# Patient Record
Sex: Male | Born: 1965 | Hispanic: No | Marital: Married | Smoking: Former smoker
Health system: Southern US, Community
[De-identification: ages and names within clinical notes are randomized; demographics above are authoritative.]

## PROBLEM LIST (undated history)

## (undated) DIAGNOSIS — I10 Essential (primary) hypertension: Secondary | ICD-10-CM

## (undated) DIAGNOSIS — E119 Type 2 diabetes mellitus without complications: Secondary | ICD-10-CM

---

## 1999-11-13 ENCOUNTER — Encounter: Payer: Self-pay | Admitting: Emergency Medicine

## 1999-11-13 ENCOUNTER — Inpatient Hospital Stay (HOSPITAL_COMMUNITY): Admission: EM | Admit: 1999-11-13 | Discharge: 1999-11-16 | Payer: Self-pay | Admitting: Emergency Medicine

## 1999-11-13 ENCOUNTER — Encounter: Payer: Self-pay | Admitting: Orthopedic Surgery

## 1999-11-14 ENCOUNTER — Encounter: Payer: Self-pay | Admitting: Orthopedic Surgery

## 1999-11-16 ENCOUNTER — Encounter: Payer: Self-pay | Admitting: Orthopedic Surgery

## 2002-06-02 ENCOUNTER — Encounter: Payer: Self-pay | Admitting: Emergency Medicine

## 2002-06-02 ENCOUNTER — Emergency Department (HOSPITAL_COMMUNITY): Admission: EM | Admit: 2002-06-02 | Discharge: 2002-06-02 | Payer: Self-pay | Admitting: Emergency Medicine

## 2002-10-27 ENCOUNTER — Ambulatory Visit (HOSPITAL_COMMUNITY): Admission: RE | Admit: 2002-10-27 | Discharge: 2002-10-27 | Payer: Self-pay | Admitting: Orthopedic Surgery

## 2002-10-27 ENCOUNTER — Encounter: Payer: Self-pay | Admitting: Orthopedic Surgery

## 2011-10-07 ENCOUNTER — Observation Stay (HOSPITAL_COMMUNITY)
Admission: EM | Admit: 2011-10-07 | Discharge: 2011-10-07 | Disposition: A | Discharge status: Home / Self Care | Payer: Self-pay | Attending: Emergency Medicine | Admitting: Emergency Medicine

## 2011-10-07 ENCOUNTER — Encounter (HOSPITAL_COMMUNITY): Payer: Self-pay | Admitting: Emergency Medicine

## 2011-10-07 DIAGNOSIS — F17200 Nicotine dependence, unspecified, uncomplicated: Secondary | ICD-10-CM | POA: Insufficient documentation

## 2011-10-07 DIAGNOSIS — T7840XA Allergy, unspecified, initial encounter: Principal | ICD-10-CM | POA: Insufficient documentation

## 2011-10-07 DIAGNOSIS — I10 Essential (primary) hypertension: Secondary | ICD-10-CM | POA: Insufficient documentation

## 2011-10-07 DIAGNOSIS — Y929 Unspecified place or not applicable: Secondary | ICD-10-CM | POA: Insufficient documentation

## 2011-10-07 HISTORY — DX: Essential (primary) hypertension: I10

## 2011-10-07 LAB — CBC
MCH: 30.8 pg (ref 26.0–34.0)
MCV: 87.7 fL (ref 78.0–100.0)
Platelets: 295 10*3/uL (ref 150–400)
RDW: 13.4 % (ref 11.5–15.5)

## 2011-10-07 LAB — BASIC METABOLIC PANEL
BUN: 17 mg/dL (ref 6–23)
CO2: 23 mEq/L (ref 19–32)
Calcium: 9.4 mg/dL (ref 8.4–10.5)
Creatinine, Ser: 0.98 mg/dL (ref 0.50–1.35)
GFR calc Af Amer: >90 mL/min (ref >90)

## 2011-10-07 MED ORDER — EPINEPHRINE 0.15 MG/0.3ML IJ DEVI
INTRAMUSCULAR | Status: AC
  Administered 2011-10-07: 0.15 mg via INTRAMUSCULAR
  Filled 2011-10-07: qty 0.3

## 2011-10-07 MED ORDER — FAMOTIDINE 20 MG PO TABS: 20.0000 mg | ORAL_TABLET | Freq: Two times a day (BID) | ORAL | Status: DC

## 2011-10-07 MED ORDER — DIPHENHYDRAMINE HCL 50 MG/ML IJ SOLN
25.0000 mg | Freq: Once | INTRAMUSCULAR | Status: AC
  Administered 2011-10-07: 25 mg via INTRAVENOUS
  Filled 2011-10-07: qty 1

## 2011-10-07 MED ORDER — FAMOTIDINE 20 MG PO TABS
20.0000 mg | ORAL_TABLET | Freq: Once | ORAL | Status: AC
  Administered 2011-10-07: 20 mg via ORAL
  Filled 2011-10-07: qty 1

## 2011-10-07 MED ORDER — EPINEPHRINE 0.15 MG/0.3ML IJ DEVI
0.1500 mg | Freq: Once | INTRAMUSCULAR | Status: AC
  Administered 2011-10-07: 0.15 mg via INTRAMUSCULAR

## 2011-10-07 MED ORDER — EPINEPHRINE 0.3 MG/0.3ML IJ DEVI: 0.3000 mg | Freq: Once | INTRAMUSCULAR | Status: DC

## 2011-10-07 MED ORDER — DIPHENHYDRAMINE HCL 25 MG PO CAPS: 25.0000 mg | ORAL_CAPSULE | Freq: Four times a day (QID) | ORAL | Status: DC | PRN

## 2011-10-07 MED ORDER — METHYLPREDNISOLONE SODIUM SUCC 125 MG IJ SOLR
125.0000 mg | Freq: Once | INTRAMUSCULAR | Status: AC
  Administered 2011-10-07: 125 mg via INTRAVENOUS
  Filled 2011-10-07: qty 2

## 2011-10-07 MED ORDER — PREDNISONE 20 MG PO TABS: 60.0000 mg | ORAL_TABLET | Freq: Every day | ORAL | Status: AC

## 2011-10-07 NOTE — ED Notes (Signed)
Pt states swelling is improving at this time.

## 2011-10-07 NOTE — ED Provider Notes (Signed)
History     CSN: [id number]  Arrival date & time 10/07/11  4098   First MD Initiated Contact with Patient 10/07/11 0116      Chief Complaint  Patient presents with  . Allergic Reaction    (Consider location/radiation/quality/duration/timing/severity/associated sxs/prior treatment) HPI HX per PT. Ate dinner around 4pm and then around 9pm developed rash and fullness in his throat, denies SOB. Rash is itchy and involves ext and torso. No N/V/D. No current medications. No known exposures, new foods, soaps or detergents. Mod in severity. No syncope.  Past Medical History  Diagnosis Date  . Hypertension     History reviewed. No pertinent past surgical history.  No family history on file.  History  Substance Use Topics  . Smoking status: Current Everyday Smoker  . Smokeless tobacco: Not on file  . Alcohol Use: Yes      Review of Systems  Constitutional: Negative for fever and chills.  HENT: Negative for neck pain and neck stiffness.   Eyes: Negative for pain.  Respiratory: Negative for shortness of breath.   Cardiovascular: Negative for chest pain.  Gastrointestinal: Negative for abdominal pain.  Genitourinary: Negative for dysuria.  Musculoskeletal: Negative for back pain.  Skin: Positive for rash.  Neurological: Negative for headaches.  All other systems reviewed and are negative.    Allergies  Review of patient's allergies indicates not on file.  Home Medications  No current outpatient prescriptions on file.  BP 119/80  Pulse 99  Temp 97.7 F (36.5 C) (Oral)  Resp 18  SpO2 97%  Physical Exam  Constitutional: He is oriented to person, place, and time. He appears well-developed and well-nourished.  HENT:  Head: Normocephalic and atraumatic.       Uvula midline, no stridor, no tongue or lip swelling  Eyes: Conjunctivae and EOM are normal. Pupils are equal, round, and reactive to light.  Neck: Trachea normal. Neck supple. No thyromegaly present.       No  palpable fullness  Cardiovascular: Normal rate, regular rhythm, S1 normal, S2 normal and normal pulses.     No systolic murmur is present   No diastolic murmur is present  Pulses:      Radial pulses are 2+ on the right side, and 2+ on the left side.  Pulmonary/Chest: Effort normal and breath sounds normal. He has no wheezes. He has no rhonchi.  Abdominal: Soft. Normal appearance and bowel sounds are normal. There is no tenderness. There is no CVA tenderness and negative Murphy's sign.  Musculoskeletal:       BLE:s Calves nontender, no cords or erythema, negative Homans sign  Neurological: He is alert and oriented to person, place, and time. He has normal strength. No cranial nerve deficit or sensory deficit. GCS eye subscore is 4. GCS verbal subscore is 5. GCS motor subscore is 6.  Skin: Skin is warm and dry. No rash noted. He is not diaphoretic.  Psychiatric: His speech is normal.       Cooperative and appropriate    ED Course  Procedures (including critical care time)  IV solumedrol and benadryl. pepcid and epi provided.   Serial evaluations, placed on monitor and on CDU OBSERVATION protocol for allergic reaction  6:23 AM resting comfortably with much improved symptoms, no fullness in throat since receiving epi 5 hours ago, requesting to be discharged home and stable for d/c at this tim.  Plan d/c home with RX steroids, benadryl and pepcid, PT agrees to strict return precautions. Written precautions provided.  PT agrees to start food diary given unk allergen. MDM   Nursing notes reviewed. VS reviewed. Labs and medications as above. OBS protocol         Sunnie Nielsen, MD 10/07/11 [phone]

## 2011-10-07 NOTE — ED Notes (Addendum)
Pt c/o rash/hives and itching all over since 9pm.  Pt states symptoms started after drinking 3 beer and eating pork.  States he feels sob. Speaking in complete sentences.

## 2011-10-07 NOTE — ED Notes (Signed)
MD Dierdre Highman notified of patient assessment, to bedside. Orders received.

## 2016-06-27 ENCOUNTER — Ambulatory Visit: Payer: Self-pay | Admitting: Emergency Medicine

## 2017-02-27 ENCOUNTER — Encounter: Payer: Self-pay | Admitting: Emergency Medicine

## 2017-02-27 ENCOUNTER — Other Ambulatory Visit: Payer: Self-pay

## 2017-02-27 ENCOUNTER — Emergency Department
Admission: EM | Admit: 2017-02-27 | Discharge: 2017-02-27 | Disposition: A | Discharge status: Home / Self Care | Payer: Self-pay | Attending: Emergency Medicine | Admitting: Emergency Medicine

## 2017-02-27 DIAGNOSIS — R2243 Localized swelling, mass and lump, lower limb, bilateral: Secondary | ICD-10-CM | POA: Insufficient documentation

## 2017-02-27 DIAGNOSIS — F17200 Nicotine dependence, unspecified, uncomplicated: Secondary | ICD-10-CM | POA: Insufficient documentation

## 2017-02-27 DIAGNOSIS — R609 Edema, unspecified: Secondary | ICD-10-CM

## 2017-02-27 DIAGNOSIS — R600 Localized edema: Secondary | ICD-10-CM | POA: Insufficient documentation

## 2017-02-27 DIAGNOSIS — I10 Essential (primary) hypertension: Secondary | ICD-10-CM | POA: Insufficient documentation

## 2017-02-27 LAB — COMPREHENSIVE METABOLIC PANEL
ALBUMIN: 4 g/dL (ref 3.5–5.0)
ALK PHOS: 84 U/L (ref 38–126)
ALT: 34 U/L (ref 17–63)
ANION GAP: 6 (ref 5–15)
AST: 22 U/L (ref 15–41)
BILIRUBIN TOTAL: 0.4 mg/dL (ref 0.3–1.2)
BUN: 10 mg/dL (ref 6–20)
CO2: 28 mmol/L (ref 22–32)
Calcium: 8.8 mg/dL — ABNORMAL LOW (ref 8.9–10.3)
Chloride: 105 mmol/L (ref 101–111)
Creatinine, Ser: 0.75 mg/dL (ref 0.61–1.24)
Glucose, Bld: 103 mg/dL — ABNORMAL HIGH (ref 65–99)
POTASSIUM: 3.7 mmol/L (ref 3.5–5.1)
Sodium: 139 mmol/L (ref 135–145)
TOTAL PROTEIN: 7.1 g/dL (ref 6.5–8.1)

## 2017-02-27 LAB — CBC WITH DIFFERENTIAL/PLATELET
BASOS PCT: 2 %
Basophils Absolute: 0.1 10*3/uL (ref 0–0.1)
Eosinophils Absolute: 0.2 10*3/uL (ref 0–0.7)
Eosinophils Relative: 3 %
HEMATOCRIT: 42.9 % (ref 40.0–52.0)
HEMOGLOBIN: 14.9 g/dL (ref 13.0–18.0)
LYMPHS ABS: 2.1 10*3/uL (ref 1.0–3.6)
Lymphocytes Relative: 29 %
MCH: 30.9 pg (ref 26.0–34.0)
MCHC: 34.6 g/dL (ref 32.0–36.0)
MCV: 89.4 fL (ref 80.0–100.0)
MONO ABS: 0.6 10*3/uL (ref 0.2–1.0)
MONOS PCT: 9 %
Neutro Abs: 4.3 10*3/uL (ref 1.4–6.5)
Neutrophils Relative %: 59 %
Platelets: 304 10*3/uL (ref 150–440)
RBC: 4.8 MIL/uL (ref 4.40–5.90)
RDW: 13.2 % (ref 11.5–14.5)
WBC: 7.4 10*3/uL (ref 3.8–10.6)

## 2017-02-27 LAB — BRAIN NATRIURETIC PEPTIDE: B NATRIURETIC PEPTIDE 5: 26 pg/mL (ref 0.0–100.0)

## 2017-02-27 MED ORDER — LISINOPRIL-HYDROCHLOROTHIAZIDE 20-25 MG PO TABS: 1.0000 | ORAL_TABLET | Freq: Every day | ORAL | 6 refills | Status: DC

## 2017-02-27 NOTE — ED Triage Notes (Signed)
Pt to ed with c/o swelling in feet and hands bilat intermittently x 6 months.  Pt states he was sent here from urgent care in [place] for evaluation.  Pt in no acute distress at this time, denies chest pain. Denies headache.

## 2017-02-27 NOTE — ED Provider Notes (Addendum)
ED ECG REPORT I, Laiklynn Raczynski, the attending physician, personally viewed and interpreted this ECG.  Date: 02/27/2017 EKG Time: 1115 Rate: 70 Rhythm: normal sinus rhythm QRS Axis: normal Intervals: normal ST/T Wave abnormalities: normal Narrative Interpretation: no evidence of acute ischemia    Sharyn CreamerQuale, Davit Vassar, MD 02/27/17 1124    Sharyn CreamerQuale, Tabor Bartram, MD 03/12/17 [phone]

## 2017-02-27 NOTE — ED Notes (Signed)
See triage note. presents with swelling to hands and feet for about 6 months.  States swelling has been intermittent and exts are painful at times also states  He has been having  Some indigestion at time  Currently take no meds

## 2017-03-12 NOTE — ED Provider Notes (Signed)
[facility] Emergency Department Provider Note  ____________________________________________   None    (approximate)  I have reviewed the triage vital signs and the nursing notes.   HISTORY  Chief Complaint bilat swelling in feet and hands    HPI Perry Hughes is a 52 y.o. male who complains of swelling in the feet and hands, states it has been going on for about 6 months, will get worse after being up for an extended period of time, he was sent here from an urgent care for evaluation, he denies chest pain, shortness of breath, abdominal pain, or injury, states he has a history of high blood pressure but is not been taking any medication for this, he states his doctor has never given him pills and he does not understand why, he denies any other health problems   Past Medical History:  Diagnosis Date  . Hypertension     There are no active problems to display for this patient.   History reviewed. No pertinent surgical history.  Prior to Admission medications   Medication Sig Start Date End Date Taking? Authorizing Provider  lisinopril-hydrochlorothiazide (PRINZIDE,ZESTORETIC) 20-25 MG tablet Take 1 tablet by mouth daily. 02/27/17   Faythe GheeFisher, Kiahna Banghart W, PA-C    Allergies Patient has no known allergies.  History reviewed. No pertinent family history.  Social History Social History   Tobacco Use  . Smoking status: Current Every Day Smoker  . Smokeless tobacco: Never Used  Substance Use Topics  . Alcohol use: Yes  . Drug use: No    Review of Systems  Constitutional: No fever/chills Eyes: No visual changes. ENT: No sore throat. Respiratory: Denies cough CARDIAC: Denies chest pain or shortness of breath, does state he has edema in the lower legs Genitourinary: Negative for dysuria. Musculoskeletal: Negative for back pain. Skin: Negative for rash.    ____________________________________________   PHYSICAL EXAM:  VITAL SIGNS: ED  Triage Vitals  Enc Vitals Group     BP 02/27/17 1019 (!) 156/100     Pulse Rate 02/27/17 1019 87     Resp 02/27/17 1019 18     Temp 02/27/17 1019 98.2 F (36.8 C)     Temp Source 02/27/17 1019 Oral     SpO2 02/27/17 1019 98 %     Weight 02/27/17 1020 225 lb (102.1 kg)     Height 02/27/17 1020 5\' 6"  (1.676 m)     Head Circumference --      Peak Flow --      Pain Score 02/27/17 1019 0     Pain Loc --      Pain Edu? --      Excl. in GC? --     Constitutional: Alert and oriented. Well appearing and in no acute distress.  Blood pressure is elevated at 150/90, and 156/100 Eyes: Conjunctivae are normal.  Head: Atraumatic. Nose: No congestion/rhinnorhea. Mouth/Throat: Mucous membranes are moist NECK: Is supple, no lymphadenopathy was noted no bruits were noted.   Cardiovascular: Normal rate, regular rhythm.  Heart sounds are normal Respiratory: Normal respiratory effort.  No retractions, lungs are clear to auscultation GU: deferred Musculoskeletal: FROM all extremities, warm and well perfused Neurologic:  Normal speech and language.  Skin:  Skin is warm, dry and intact. No rash noted. Psychiatric: Mood and affect are normal. Speech and behavior are normal.  ____________________________________________   LABS (all labs ordered are listed, but only abnormal results are displayed)  Labs Reviewed  COMPREHENSIVE METABOLIC PANEL - Abnormal; Notable  for the following components:      Result Value   Glucose, Bld 103 (*)    Calcium 8.8 (*)    All other components within normal limits  CBC WITH DIFFERENTIAL/PLATELET  BRAIN NATRIURETIC PEPTIDE   ____________________________________________   ____________________________________________  RADIOLOGY    ____________________________________________   PROCEDURES  Procedure(s) performed: No  Procedures    ____________________________________________   INITIAL IMPRESSION / ASSESSMENT AND PLAN / ED COURSE  Pertinent labs &  imaging results that were available during my care of the patient were reviewed by me and considered in my medical decision making (see chart for details).  Patient is a 52 year old male complaining of dependent edema, in the hands and the feet, he has a history of elevated blood pressure but his doctors never treated him for this, he denies chest pain or shortness of breath, the EKG was interpreted by Dr. Fanny Bien,  On physical exam the patient appears to have swelling in the feet and ankles that 1+ edema it is not pitting at this time, lungs were clear to auscultation, and heart sounds were normal,  Labs appear to be grossly normal, with negative BNP, patient was diagnosed with dependent edema, and hypertension  Patient was given a prescription for lisinopril-hydrochlorothiazide 20-25, he was instructed to follow-up with his regular doctor for further evaluation of his hypertension and dependent edema, patient states he understands, this was all done through a [facility] interpreter, patient was discharged in stable condition     As part of my medical decision making, I reviewed the following data within the electronic MEDICAL RECORD NUMBER Interpreter needed, Labs reviewed , EKG interpreted by heart station doctor, Old chart reviewed, Notes from prior ED visits and [place] Controlled Substance Database  ____________________________________________   FINAL CLINICAL IMPRESSION(S) / ED DIAGNOSES  Final diagnoses:  Essential hypertension  Dependent edema      NEW MEDICATIONS STARTED DURING THIS VISIT:  Discharge Medication List as of 02/27/2017 12:49 PM    START taking these medications   Details  lisinopril-hydrochlorothiazide (PRINZIDE,ZESTORETIC) 20-25 MG tablet Take 1 tablet by mouth daily., Starting Thu 02/27/2017, Print         Note:  This document was prepared using Dragon voice recognition software and may include unintentional dictation errors.    Faythe Ghee, PA-C 03/12/17 Mancel Parsons, MD 03/12/17 2013

## 2018-07-25 ENCOUNTER — Emergency Department (HOSPITAL_COMMUNITY)
Admission: EM | Admit: 2018-07-25 | Discharge: 2018-07-25 | Disposition: A | Discharge status: Home / Self Care | Payer: Self-pay | Attending: Emergency Medicine | Admitting: Emergency Medicine

## 2018-07-25 ENCOUNTER — Emergency Department (HOSPITAL_COMMUNITY): Payer: Self-pay

## 2018-07-25 ENCOUNTER — Encounter (HOSPITAL_COMMUNITY): Payer: Self-pay

## 2018-07-25 ENCOUNTER — Other Ambulatory Visit: Payer: Self-pay

## 2018-07-25 DIAGNOSIS — R300 Dysuria: Secondary | ICD-10-CM | POA: Insufficient documentation

## 2018-07-25 DIAGNOSIS — Z79899 Other long term (current) drug therapy: Secondary | ICD-10-CM | POA: Insufficient documentation

## 2018-07-25 DIAGNOSIS — I10 Essential (primary) hypertension: Secondary | ICD-10-CM | POA: Insufficient documentation

## 2018-07-25 DIAGNOSIS — F17200 Nicotine dependence, unspecified, uncomplicated: Secondary | ICD-10-CM | POA: Insufficient documentation

## 2018-07-25 DIAGNOSIS — N200 Calculus of kidney: Secondary | ICD-10-CM | POA: Insufficient documentation

## 2018-07-25 LAB — COMPREHENSIVE METABOLIC PANEL
ALT: 43 U/L (ref 0–44)
AST: 30 U/L (ref 15–41)
Albumin: 3.7 g/dL (ref 3.5–5.0)
Alkaline Phosphatase: 79 U/L (ref 38–126)
Anion gap: 12 (ref 5–15)
BUN: 9 mg/dL (ref 6–20)
CO2: 24 mmol/L (ref 22–32)
Calcium: 9.7 mg/dL (ref 8.9–10.3)
Chloride: 102 mmol/L (ref 98–111)
Creatinine, Ser: 0.85 mg/dL (ref 0.61–1.24)
GFR calc Af Amer: >60 mL/min (ref >60)
GFR calc non Af Amer: >60 mL/min (ref >60)
Glucose, Bld: 136 mg/dL — ABNORMAL HIGH (ref 70–99)
Potassium: 3.5 mmol/L (ref 3.5–5.1)
Sodium: 138 mmol/L (ref 135–145)
Total Bilirubin: 0.5 mg/dL (ref 0.3–1.2)
Total Protein: 7.1 g/dL (ref 6.5–8.1)

## 2018-07-25 LAB — CBC
HCT: 43.7 % (ref 39.0–52.0)
Hemoglobin: 14.9 g/dL (ref 13.0–17.0)
MCH: 30.3 pg (ref 26.0–34.0)
MCHC: 34.1 g/dL (ref 30.0–36.0)
MCV: 88.8 fL (ref 80.0–100.0)
Platelets: 324 10*3/uL (ref 150–400)
RBC: 4.92 MIL/uL (ref 4.22–5.81)
RDW: 12.8 % (ref 11.5–15.5)
WBC: 8 10*3/uL (ref 4.0–10.5)
nRBC: 0 % (ref 0.0–0.2)

## 2018-07-25 LAB — URINALYSIS, ROUTINE W REFLEX MICROSCOPIC
Bilirubin Urine: NEGATIVE
Glucose, UA: NEGATIVE mg/dL
Hgb urine dipstick: NEGATIVE
Ketones, ur: NEGATIVE mg/dL
Leukocytes,Ua: NEGATIVE
Nitrite: NEGATIVE
Protein, ur: NEGATIVE mg/dL
Specific Gravity, Urine: 1.005 (ref 1.005–1.030)
pH: 6 (ref 5.0–8.0)

## 2018-07-25 LAB — LIPASE, BLOOD: Lipase: 26 U/L (ref 11–51)

## 2018-07-25 MED ORDER — KETOROLAC TROMETHAMINE 30 MG/ML IJ SOLN
30.0000 mg | Freq: Once | INTRAMUSCULAR | Status: AC
  Administered 2018-07-25: 05:00:00 30 mg via INTRAVENOUS
  Filled 2018-07-25: qty 1

## 2018-07-25 MED ORDER — TAMSULOSIN HCL 0.4 MG PO CAPS: 0.4000 mg | ORAL_CAPSULE | Freq: Every day | ORAL | 0 refills | Status: DC

## 2018-07-25 MED ORDER — IBUPROFEN 800 MG PO TABS: 800.0000 mg | ORAL_TABLET | Freq: Three times a day (TID) | ORAL | 0 refills | Status: DC | PRN

## 2018-07-25 MED ORDER — ONDANSETRON 4 MG PO TBDP: 4.0000 mg | ORAL_TABLET | Freq: Four times a day (QID) | ORAL | 0 refills | Status: DC | PRN

## 2018-07-25 MED ORDER — HYDROCODONE-ACETAMINOPHEN 5-325 MG PO TABS: 1.0000 | ORAL_TABLET | ORAL | 0 refills | Status: DC | PRN

## 2018-07-25 NOTE — ED Provider Notes (Signed)
TIME SEEN: 4:49 AM  CHIEF COMPLAINT: Flank pain, abdominal pain  HPI: Patient is a 53 year old male with history of hypertension, obesity, kidney stones who presents to the emergency department with left flank pain that radiates into the left abdomen that is sharp and intermittent in nature.  Symptoms have been ongoing for 3 months but worsened tonight.  He feels somewhat similar to his previous kidney stone that he had 20 years ago.  States he had to have "laser" treatment to help remove the stone.  No other abdominal surgeries.  States he has had an electric shooting pain at times in his penis sometimes with urinating and sometimes not.  He has had some dysuria but no hematuria, penile discharge, testicular pain or swelling.  No fevers, chills, chest pain, shortness of breath, nausea, vomiting or diarrhea.  Sexually active only with his wife.  Spanish interpreter used.  ROS: See HPI Constitutional: no fever  Eyes: no drainage  ENT: no runny nose   Cardiovascular:  no chest pain  Resp: no SOB  GI: no vomiting GU:  dysuria Integumentary: no rash  Allergy: no hives  Musculoskeletal: no leg swelling  Neurological: no slurred speech ROS otherwise negative  PAST MEDICAL HISTORY/PAST SURGICAL HISTORY:  Past Medical History:  Diagnosis Date  . Hypertension     MEDICATIONS:  Prior to Admission medications   Medication Sig Start Date End Date Taking? Authorizing Provider  lisinopril-hydrochlorothiazide (PRINZIDE,ZESTORETIC) 20-25 MG tablet Take 1 tablet by mouth daily. 02/27/17   Sherrie Mustache Roselyn Bering, PA-C    ALLERGIES:  No Known Allergies  SOCIAL HISTORY:  Social History   Tobacco Use  . Smoking status: Current Every Day Smoker  . Smokeless tobacco: Never Used  Substance Use Topics  . Alcohol use: Yes    FAMILY HISTORY: No family history on file.  EXAM: BP (!) 172/89 (BP Location: Right Arm)   Pulse 80   Temp 98 F (36.7 C) (Oral)   Resp 18   Ht 5\' 4"  (1.626 m)   Wt 106.6  kg   SpO2 98%   BMI 40.34 kg/m  CONSTITUTIONAL: Alert and oriented and responds appropriately to questions. Well-appearing; well-nourished, obese, in no distress HEAD: Normocephalic EYES: Conjunctivae clear, pupils appear equal, EOMI ENT: normal nose; moist mucous membranes NECK: Supple, no meningismus, no nuchal rigidity, no LAD  CARD: RRR; S1 and S2 appreciated; no murmurs, no clicks, no rubs, no gallops RESP: Normal chest excursion without splinting or tachypnea; breath sounds clear and equal bilaterally; no wheezes, no rhonchi, no rales, no hypoxia or respiratory distress, speaking full sentences ABD/GI: Normal bowel sounds; non-distended; soft, ender to palpation diffusely but more so in the left lower quadrant without guarding or rebound GU:  Normal external genitalia, circumcised male, normal penile shaft, no blood or discharge at the urethral meatus, no testicular masses or tenderness on exam, no scrotal masses or swelling, no hernias appreciated, 2+ femoral pulses bilaterally; no perineal erythema, warmth, subcutaneous air or crepitus; no high riding testicle, normal bilateral cremasteric reflex.  Chaperone present for exam. BACK:  The back appears normal, there is left CVA tenderness but no midline spinal tenderness or step-off or deformity, no ecchymosis or swelling, no redness or warmth EXT: Normal ROM in all joints; non-tender to palpation; no edema; normal capillary refill; no cyanosis, no calf tenderness or swelling    SKIN: Normal color for age and race; warm; no rash NEURO: Moves all extremities equally PSYCH: The patient's mood and manner are appropriate. Grooming and  personal hygiene are appropriate.  MEDICAL DECISION MAKING: Patient here with left-sided flank pain radiating into the left lower quadrant.  Differential includes kidney stone, pyelonephritis, UTI, diverticulitis, colitis.  Will obtain labs, urine, CT imaging.  Will give Toradol for pain.  ED PROGRESS: Patient  reports feeling much better.  Labs are unremarkable.  Urine shows no blood or infection.  CT scan shows a 6 mm distal left ureteral calculus at the level of the sacral ureter and an additional 5 mm nonobstructing left lower pole renal calculus.  There is a left inguinal hernia that contains fat.  I do not appreciate a significant hernia on my examination but exam is limited secondary to obesity.  Will discharge home with prescriptions of hydrocodone, ibuprofen, Zofran, Flomax and give outpatient urology follow-up.  Discussed return precautions.  Discussed with patient that he will likely need surgical intervention for this stone given he has had this pain for several months.    At this time, I do not feel there is any life-threatening condition present. I have reviewed and discussed all results (EKG, imaging, lab, urine as appropriate) and exam findings with patient/family. I have reviewed nursing notes and appropriate previous records.  I feel the patient is safe to be discharged home without further emergent workup and can continue workup as an outpatient as needed. Discussed usual and customary return precautions. Patient/family verbalize understanding and are comfortable with this plan.  Outpatient follow-up has been provided as needed. All questions have been answered.      Raelan Burgoon, Layla MawKristen N, DO 07/25/18 [id number]

## 2018-07-25 NOTE — ED Notes (Signed)
Patient transported to CT 

## 2018-07-25 NOTE — ED Notes (Signed)
Urine culture sent to lab with urine sample.  

## 2018-07-25 NOTE — ED Triage Notes (Signed)
Pt states that for the past several days he has been having LLQ abd pain that radiates to L flank, denies fevers or nausea.

## 2020-05-31 IMAGING — CT CT RENAL STONE PROTOCOL
2 of 4 series · 16 of 46 positions shown, 18 images · non-contrast
Comparison: None.

CLINICAL DATA: Lower abdominal pain

EXAM:
CT ABDOMEN AND PELVIS WITHOUT CONTRAST
TECHNIQUE: Multidetector CT imaging of the abdomen and pelvis was performed
following the standard protocol without IV contrast.

[Series 3: renal stone 5.0 · axial · 0.96mm/px · z∈[-517,-57]mm · 13 of 102 slices shown, 15 images]
[im 5/102  soft-tissue]
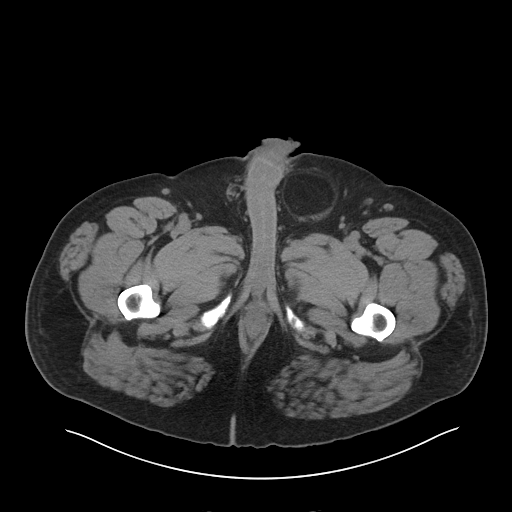
[im 5/102  bone]
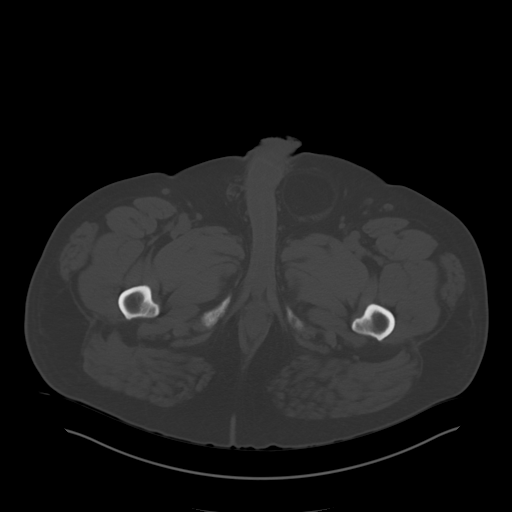
[im 15/102  soft-tissue]
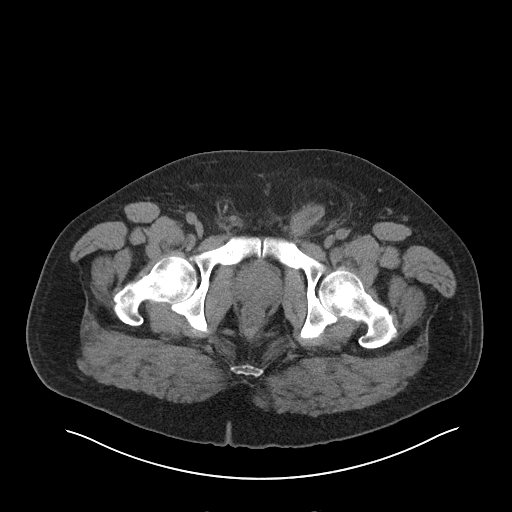
[im 20/102  soft-tissue]
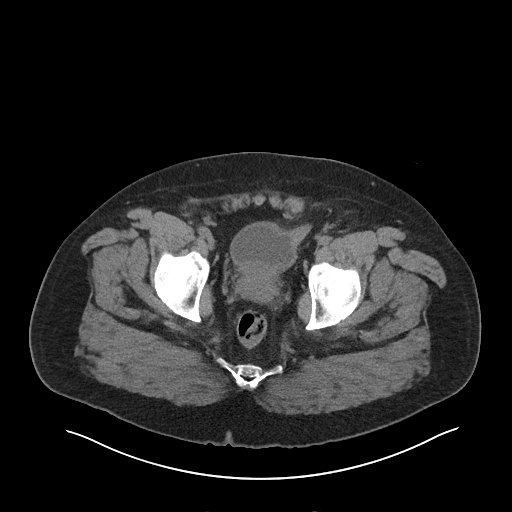
[im 29/102  soft-tissue]
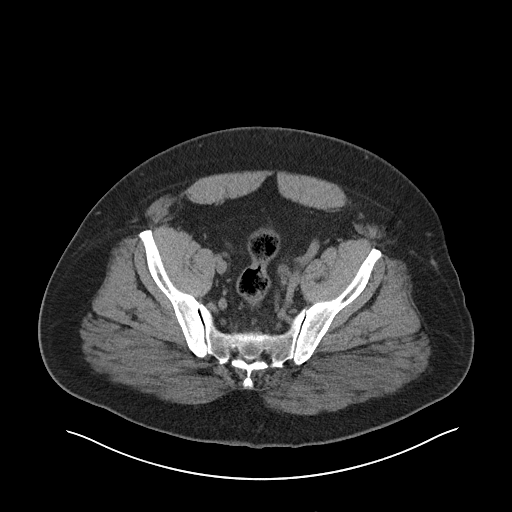
[im 34/102  soft-tissue]
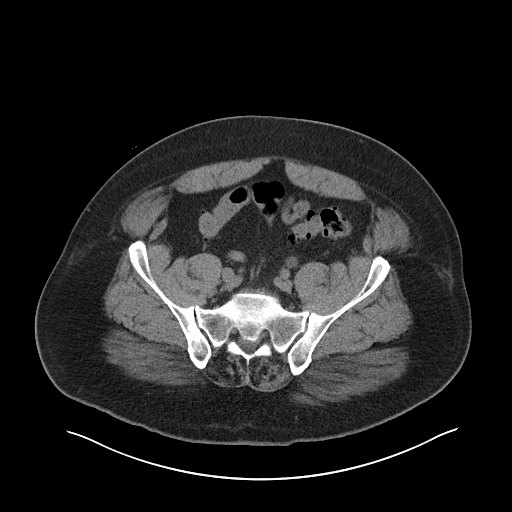
[im 44/102  soft-tissue]
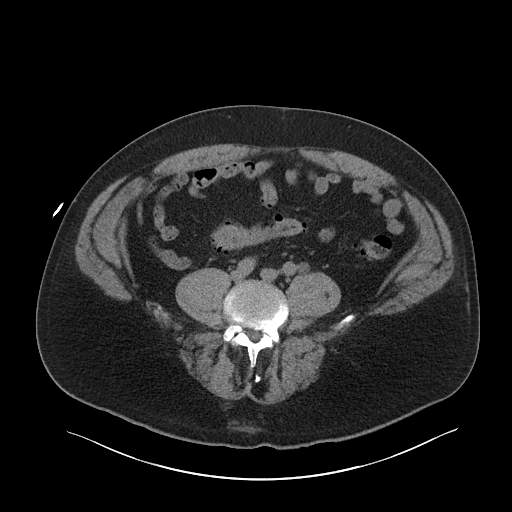
[im 53/102  soft-tissue]
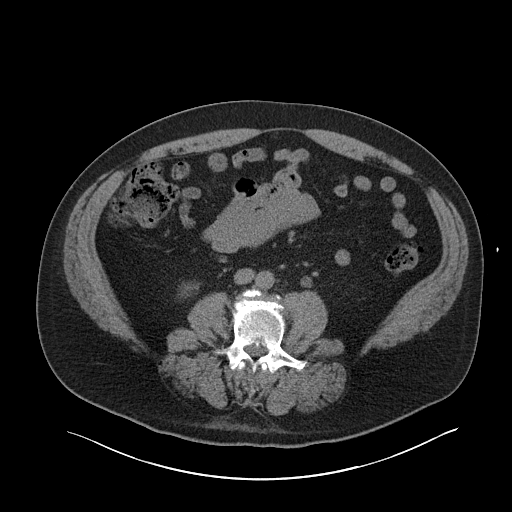
[im 58/102  soft-tissue]
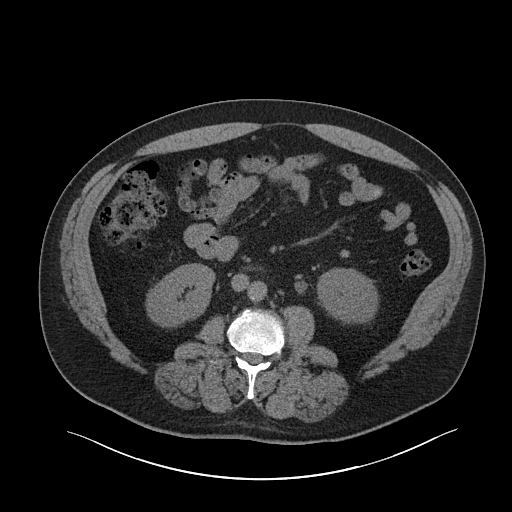
[im 68/102  soft-tissue]
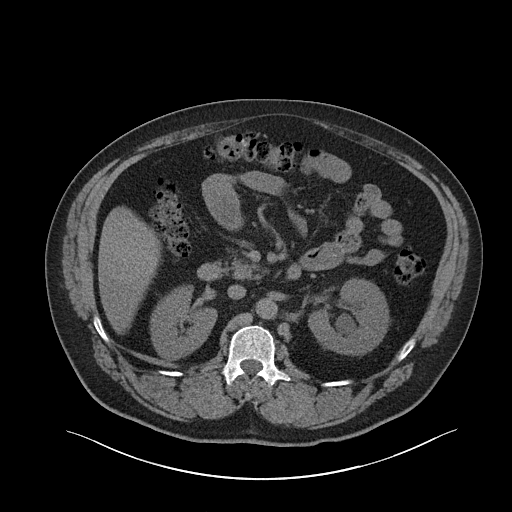
[im 68/102  bone]
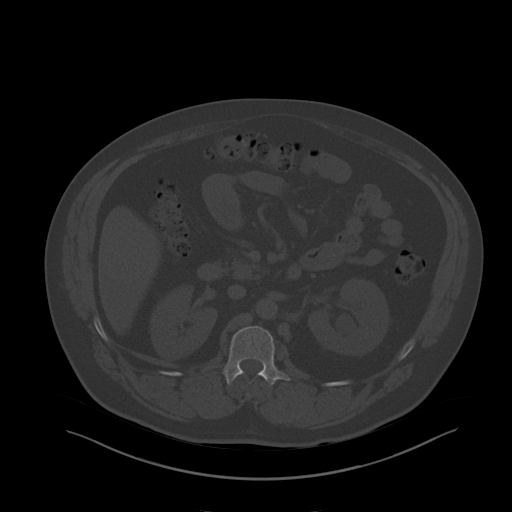
[im 73/102  soft-tissue]
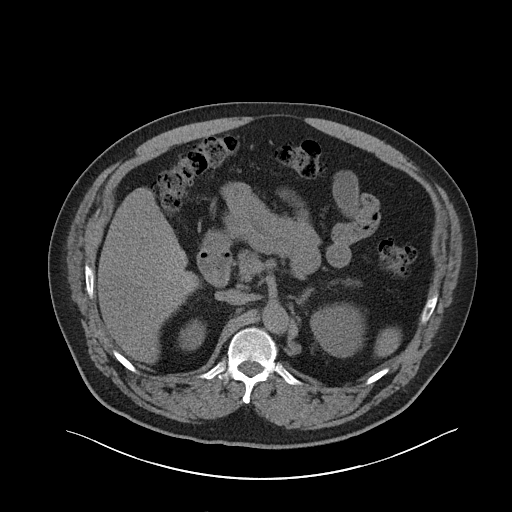
[im 82/102  soft-tissue]
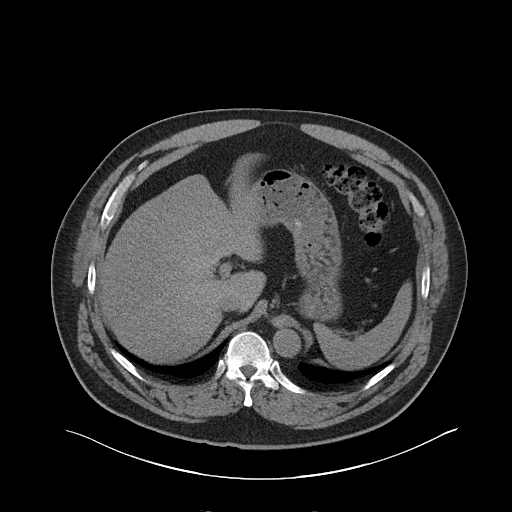
[im 87/102  soft-tissue]
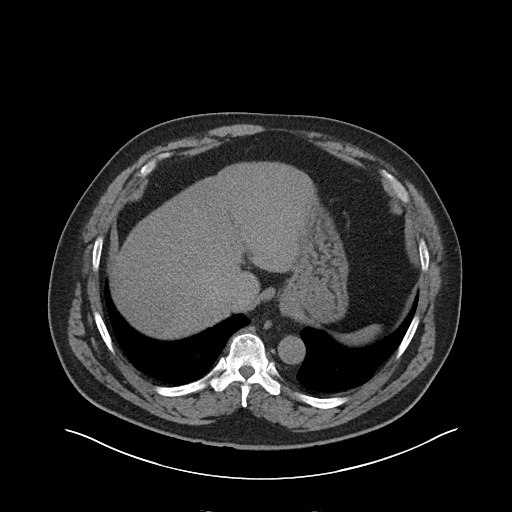
[im 97/102  soft-tissue]
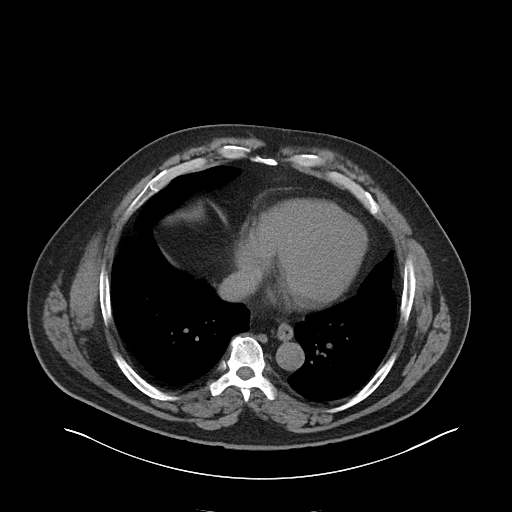

[Series 5: renal stone 3.0 cor · coronal · 0.85mm/px · 3 of 105 slices shown]
[im 35/105  soft-tissue]
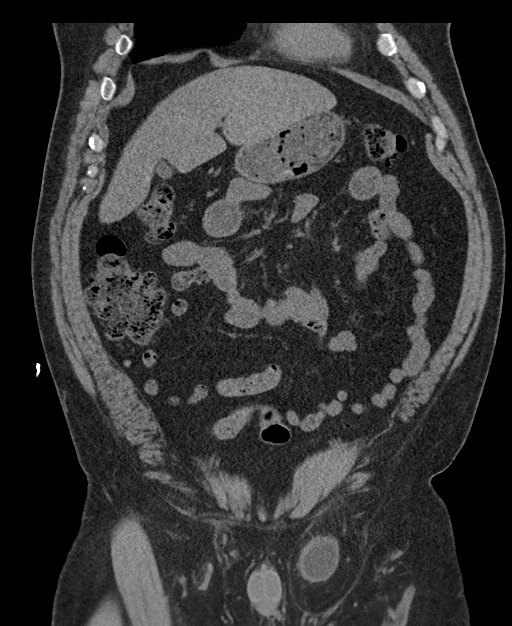
[im 47/105  soft-tissue]
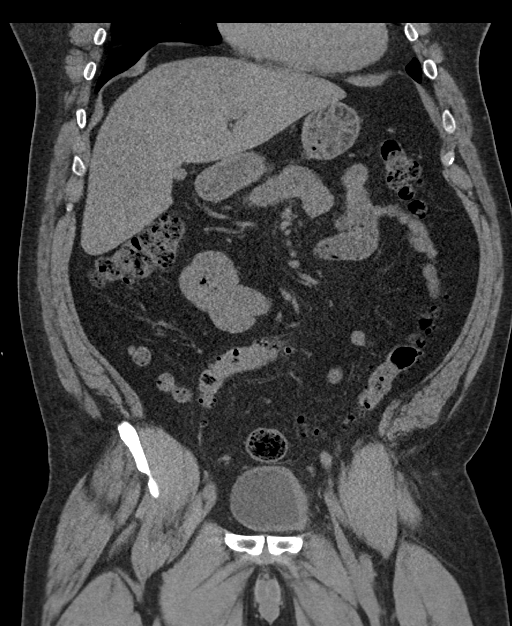
[im 58/105  soft-tissue]
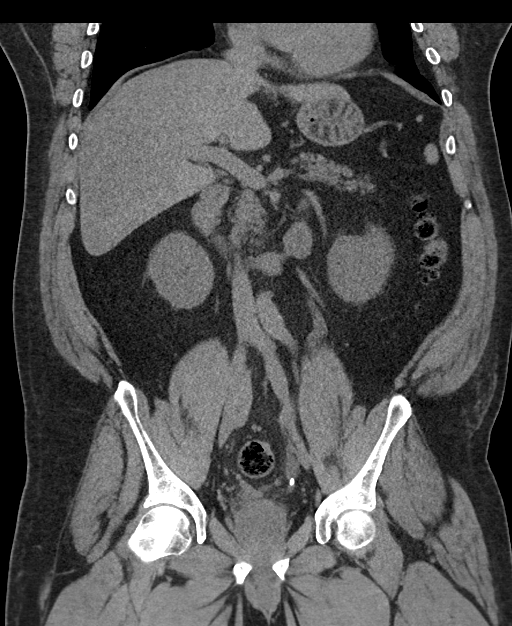

[16 of 46 positions shown; findings below may reference images not displayed]

FINDINGS: Lower chest: Lung bases are clear.

Hepatobiliary: Mild hepatic steatosis.

Gallbladder is unremarkable. No intrahepatic or extrahepatic ductal
dilatation.

Pancreas: Within normal limits.

Spleen: Within normal limits.

Adrenals/Urinary Tract: Adrenal glands are within normal limits.

Right kidney is within normal limits. 5 mm nonobstructing left lower
pole renal calculus (series 3/image 41). Mild left
hydroureteronephrosis. Associated 6 mm distal left ureteral calculus
below the level of the sacral ureter (coronal image 59).

Thick-walled bladder, although underdistended. Associated herniation
of the left anterior bladder into an inguinal hernia (series 3/image
87).

Stomach/Bowel: Stomach is within normal limits.

No evidence of bowel obstruction.

Normal appendix (series 3/image 58).

Mild left colonic diverticulosis, without evidence of
diverticulitis.

Vascular/Lymphatic: No evidence of abdominal aortic aneurysm.

Atherosclerotic calcifications of the abdominal aorta and branch
vessels.

No suspicious abdominopelvic lymphadenopathy.

Reproductive: Prostate is grossly unremarkable.

Other: No abdominopelvic ascites.

Moderate fat/bladder containing left inguinal hernia (series 3/image
93).

Musculoskeletal: Degenerative changes of the lumbar spine.
IMPRESSION: 6 mm distal left ureteral calculus below the level of the sacral
ureter. Associated mild left hydroureteronephrosis.

Additional 5 mm nonobstructing left lower pole renal calculus.

Moderate fat/bladder containing left inguinal hernia.

Additional ancillary findings as above.

## 2020-08-26 ENCOUNTER — Emergency Department (HOSPITAL_COMMUNITY): Payer: Self-pay

## 2020-08-26 ENCOUNTER — Other Ambulatory Visit: Payer: Self-pay

## 2020-08-26 ENCOUNTER — Emergency Department: Payer: Self-pay

## 2020-08-26 ENCOUNTER — Inpatient Hospital Stay (HOSPITAL_COMMUNITY): Payer: Self-pay

## 2020-08-26 ENCOUNTER — Inpatient Hospital Stay (HOSPITAL_COMMUNITY)
Admission: EM | Admit: 2020-08-26 | Discharge: 2020-08-29 | DRG: 065 | Disposition: A | Discharge status: Home / Self Care | Payer: Self-pay | Attending: Internal Medicine | Admitting: Internal Medicine

## 2020-08-26 ENCOUNTER — Encounter (HOSPITAL_COMMUNITY): Payer: Self-pay | Admitting: Emergency Medicine

## 2020-08-26 DIAGNOSIS — I63311 Cerebral infarction due to thrombosis of right middle cerebral artery: Secondary | ICD-10-CM

## 2020-08-26 DIAGNOSIS — G8194 Hemiplegia, unspecified affecting left nondominant side: Secondary | ICD-10-CM | POA: Diagnosis present

## 2020-08-26 DIAGNOSIS — E669 Obesity, unspecified: Secondary | ICD-10-CM | POA: Diagnosis present

## 2020-08-26 DIAGNOSIS — Z20822 Contact with and (suspected) exposure to covid-19: Secondary | ICD-10-CM | POA: Diagnosis present

## 2020-08-26 DIAGNOSIS — Z7984 Long term (current) use of oral hypoglycemic drugs: Secondary | ICD-10-CM

## 2020-08-26 DIAGNOSIS — E1165 Type 2 diabetes mellitus with hyperglycemia: Secondary | ICD-10-CM | POA: Diagnosis present

## 2020-08-26 DIAGNOSIS — G4733 Obstructive sleep apnea (adult) (pediatric): Secondary | ICD-10-CM | POA: Diagnosis present

## 2020-08-26 DIAGNOSIS — R471 Dysarthria and anarthria: Secondary | ICD-10-CM | POA: Diagnosis present

## 2020-08-26 DIAGNOSIS — I10 Essential (primary) hypertension: Secondary | ICD-10-CM

## 2020-08-26 DIAGNOSIS — R29702 NIHSS score 2: Secondary | ICD-10-CM | POA: Diagnosis present

## 2020-08-26 DIAGNOSIS — Z833 Family history of diabetes mellitus: Secondary | ICD-10-CM

## 2020-08-26 DIAGNOSIS — E785 Hyperlipidemia, unspecified: Secondary | ICD-10-CM | POA: Diagnosis present

## 2020-08-26 DIAGNOSIS — I639 Cerebral infarction, unspecified: Secondary | ICD-10-CM

## 2020-08-26 DIAGNOSIS — R0902 Hypoxemia: Secondary | ICD-10-CM

## 2020-08-26 DIAGNOSIS — R29810 Facial weakness: Secondary | ICD-10-CM | POA: Diagnosis present

## 2020-08-26 DIAGNOSIS — I63031 Cerebral infarction due to thrombosis of right carotid artery: Principal | ICD-10-CM | POA: Diagnosis present

## 2020-08-26 DIAGNOSIS — Z6841 Body Mass Index (BMI) 40.0 and over, adult: Secondary | ICD-10-CM

## 2020-08-26 DIAGNOSIS — F17210 Nicotine dependence, cigarettes, uncomplicated: Secondary | ICD-10-CM | POA: Diagnosis present

## 2020-08-26 DIAGNOSIS — Z8249 Family history of ischemic heart disease and other diseases of the circulatory system: Secondary | ICD-10-CM

## 2020-08-26 HISTORY — DX: Type 2 diabetes mellitus without complications: E11.9

## 2020-08-26 LAB — LIPID PANEL
Cholesterol: 209 mg/dL — ABNORMAL HIGH (ref 0–200)
HDL: 33 mg/dL — ABNORMAL LOW (ref >40)
LDL Cholesterol: 158 mg/dL — ABNORMAL HIGH (ref 0–99)
Total CHOL/HDL Ratio: 6.3 RATIO
Triglycerides: 90 mg/dL (ref <150)
VLDL: 18 mg/dL (ref 0–40)

## 2020-08-26 LAB — I-STAT CHEM 8, ED
BUN: 7 mg/dL (ref 6–20)
Calcium, Ion: 1.16 mmol/L (ref 1.15–1.40)
Chloride: 102 mmol/L (ref 98–111)
Creatinine, Ser: 0.7 mg/dL (ref 0.61–1.24)
Glucose, Bld: 164 mg/dL — ABNORMAL HIGH (ref 70–99)
HCT: 44 % (ref 39.0–52.0)
Hemoglobin: 15 g/dL (ref 13.0–17.0)
Potassium: 3.5 mmol/L (ref 3.5–5.1)
Sodium: 140 mmol/L (ref 135–145)
TCO2: 27 mmol/L (ref 22–32)

## 2020-08-26 LAB — CBC
HCT: 42.9 % (ref 39.0–52.0)
Hemoglobin: 15 g/dL (ref 13.0–17.0)
MCH: 30.9 pg (ref 26.0–34.0)
MCHC: 35 g/dL (ref 30.0–36.0)
MCV: 88.3 fL (ref 80.0–100.0)
Platelets: 338 10*3/uL (ref 150–400)
RBC: 4.86 MIL/uL (ref 4.22–5.81)
RDW: 12.8 % (ref 11.5–15.5)
WBC: 9 10*3/uL (ref 4.0–10.5)
nRBC: 0 % (ref 0.0–0.2)

## 2020-08-26 LAB — RAPID URINE DRUG SCREEN, HOSP PERFORMED
Amphetamines: NOT DETECTED
Barbiturates: NOT DETECTED
Benzodiazepines: NOT DETECTED
Cocaine: NOT DETECTED
Opiates: NOT DETECTED
Tetrahydrocannabinol: NOT DETECTED

## 2020-08-26 LAB — DIFFERENTIAL
Abs Immature Granulocytes: 0.02 10*3/uL (ref 0.00–0.07)
Basophils Absolute: 0.1 10*3/uL (ref 0.0–0.1)
Basophils Relative: 1 %
Eosinophils Absolute: 0.2 10*3/uL (ref 0.0–0.5)
Eosinophils Relative: 3 %
Immature Granulocytes: 0 %
Lymphocytes Relative: 19 %
Lymphs Abs: 1.8 10*3/uL (ref 0.7–4.0)
Monocytes Absolute: 0.7 10*3/uL (ref 0.1–1.0)
Monocytes Relative: 8 %
Neutro Abs: 6.3 10*3/uL (ref 1.7–7.7)
Neutrophils Relative %: 69 %

## 2020-08-26 LAB — APTT: aPTT: 32 seconds (ref 24–36)

## 2020-08-26 LAB — COMPREHENSIVE METABOLIC PANEL
ALT: 29 U/L (ref 0–44)
AST: 19 U/L (ref 15–41)
Albumin: 3.7 g/dL (ref 3.5–5.0)
Alkaline Phosphatase: 82 U/L (ref 38–126)
Anion gap: 8 (ref 5–15)
BUN: 7 mg/dL (ref 6–20)
CO2: 26 mmol/L (ref 22–32)
Calcium: 8.9 mg/dL (ref 8.9–10.3)
Chloride: 103 mmol/L (ref 98–111)
Creatinine, Ser: 0.84 mg/dL (ref 0.61–1.24)
GFR, Estimated: >60 mL/min (ref >60)
Glucose, Bld: 164 mg/dL — ABNORMAL HIGH (ref 70–99)
Potassium: 3.5 mmol/L (ref 3.5–5.1)
Sodium: 137 mmol/L (ref 135–145)
Total Bilirubin: 0.6 mg/dL (ref 0.3–1.2)
Total Protein: 6.9 g/dL (ref 6.5–8.1)

## 2020-08-26 LAB — HEMOGLOBIN A1C
Hgb A1c MFr Bld: 6.9 % — ABNORMAL HIGH (ref 4.8–5.6)
Mean Plasma Glucose: 151.33 mg/dL

## 2020-08-26 LAB — RESP PANEL BY RT-PCR (FLU A&B, COVID) ARPGX2
Influenza A by PCR: NEGATIVE
Influenza B by PCR: NEGATIVE
SARS Coronavirus 2 by RT PCR: NEGATIVE

## 2020-08-26 LAB — ETHANOL: Alcohol, Ethyl (B): <10 mg/dL (ref <10)

## 2020-08-26 LAB — CBG MONITORING, ED: Glucose-Capillary: 160 mg/dL — ABNORMAL HIGH (ref 70–99)

## 2020-08-26 LAB — PROTIME-INR
INR: 1 (ref 0.8–1.2)
Prothrombin Time: 13.5 seconds (ref 11.4–15.2)

## 2020-08-26 LAB — HEPARIN LEVEL (UNFRACTIONATED): Heparin Unfractionated: <0.1 IU/mL — ABNORMAL LOW (ref 0.30–0.70)

## 2020-08-26 LAB — HIV ANTIBODY (ROUTINE TESTING W REFLEX): HIV Screen 4th Generation wRfx: NONREACTIVE

## 2020-08-26 MED ORDER — HEPARIN (PORCINE) 25000 UT/250ML-% IV SOLN
1650.0000 [IU]/h | INTRAVENOUS | Status: DC
  Administered 2020-08-26: 1000 [IU]/h via INTRAVENOUS
  Filled 2020-08-26 (×3): qty 250

## 2020-08-26 MED ORDER — ACETAMINOPHEN 160 MG/5ML PO SOLN: 650.0000 mg | ORAL | Status: DC | PRN

## 2020-08-26 MED ORDER — STROKE: EARLY STAGES OF RECOVERY BOOK
Freq: Once | Status: DC
  Filled 2020-08-26: qty 1

## 2020-08-26 MED ORDER — HYDRALAZINE HCL 20 MG/ML IJ SOLN: 5.0000 mg | Freq: Four times a day (QID) | INTRAMUSCULAR | Status: DC | PRN

## 2020-08-26 MED ORDER — SODIUM CHLORIDE 0.9% FLUSH: 3.0000 mL | Freq: Once | INTRAVENOUS | Status: DC

## 2020-08-26 MED ORDER — ACETAMINOPHEN 325 MG PO TABS: 650.0000 mg | ORAL_TABLET | ORAL | Status: DC | PRN

## 2020-08-26 MED ORDER — ACETAMINOPHEN 650 MG RE SUPP: 650.0000 mg | RECTAL | Status: DC | PRN

## 2020-08-26 MED ORDER — IOHEXOL 350 MG/ML SOLN
100.0000 mL | Freq: Once | INTRAVENOUS | Status: AC | PRN
  Administered 2020-08-26: 100 mL via INTRAVENOUS

## 2020-08-26 NOTE — Consult Note (Signed)
Neurology Consultation  Reason for Consult: Code Stroke: Left-sided weakness, dysarthria Referring Physician: Dr. Particia NearingHaviland  CC: Dysarthria, left-sided weakness  History is obtained from: Patient, Patient's wife at bedside, Chart review  HPI: Perry Hughes is a 55 y.o. male with a medical history significant for essential hypertension, obesity- BMI 40.23, and tobacco use who presented to [facility] for evaluation of left-sided weakness and slurred speech. Patient states that his wife noticed his speech was slurred last night starting at 21:30 with some left-sided weakness. He thought it was associated with his blood pressure so he took his medication and went to bed. This morning when he woke up, he was not feeling better prompting him to go to the ER for further evaluation. Of note, his wife states that these symptoms had occurred on Saturday, June 25th as well when he was eating breakfast but resolved after approximately 15 minutes once he took his blood pressure medication.   LKW: 08/25/2020 21:30 tpa given?: no, outside of time window IR Thrombectomy? No, presentation not consistent with LVO, vessel imaging without large vessel occlusion identified Modified Rankin Scale: 0-Completely asymptomatic and back to baseline post- stroke  ROS: A complete ROS was performed and is negative except as noted in the HPI.  Past Medical History:  Diagnosis Date   Hypertension   No past surgical history on file.  Family History: Mother: hypertension, cardiac disease- unspecified Father: type 2 diabetes mellitus  Social History:   reports that he has been smoking. He has never used smokeless tobacco. He reports current alcohol use. He reports that he does not use drugs.  Medications  Current Facility-Administered Medications:    sodium chloride flush (NS) 0.9 % injection 3 mL, 3 mL, Intravenous, Once, Jacalyn LefevreHaviland, Julie, MD  Current Outpatient Medications:    HYDROcodone-acetaminophen  (NORCO/VICODIN) 5-325 MG tablet, Take 1 tablet by mouth every 4 (four) hours as needed., Disp: 15 tablet, Rfl: 0   ibuprofen (ADVIL) 800 MG tablet, Take 1 tablet (800 mg total) by mouth every 8 (eight) hours as needed for mild pain., Disp: 30 tablet, Rfl: 0   lisinopril-hydrochlorothiazide (PRINZIDE,ZESTORETIC) 20-25 MG tablet, Take 1 tablet by mouth daily. (Patient not taking: Reported on 07/25/2018), Disp: 30 tablet, Rfl: 6   ondansetron (ZOFRAN ODT) 4 MG disintegrating tablet, Take 1 tablet (4 mg total) by mouth every 6 (six) hours as needed., Disp: 20 tablet, Rfl: 0   tamsulosin (FLOMAX) 0.4 MG CAPS capsule, Take 1 capsule (0.4 mg total) by mouth daily., Disp: 30 capsule, Rfl: 0  Exam: Current vital signs: BP (!) 176/114 (BP Location: Left Arm)   Pulse 84   Temp 99.2 F (37.3 C) (Oral)   Resp 20   SpO2 99%  Vital signs in last 24 hours: Temp:  [99.2 F (37.3 C)] 99.2 F (37.3 C) (07/02 0736) Pulse Rate:  [84] 84 (07/02 0736) Resp:  [20] 20 (07/02 0736) BP: (176)/(114) 176/114 (07/02 0736) SpO2:  [99 %] 99 % (07/02 0736)  GENERAL: Awake, alert, in no acute distress Psych: Affect appropriate for situation, calm and cooperative with examination Head: Normocephalic and atraumatic, without obvious abnormality EENT: No OP obstruction, normal conjunctivae LUNGS: Normal respiratory effort. Non-labored breathing CV: Regular rate on telemetry, no pedal edema ABDOMEN: Soft, rounded, non-distended Ext: warm, well perfused, no obvious deformity  NEURO:  Mental Status: Awake, alert, and oriented to self, age, month, place, and situation.  He is able to provide a clear and coherent history of present illness. Speech/Language: speech is dysarthric.  Naming, repetition, fluency, and comprehension intact. No aphasia noted. No neglect present.  Cranial Nerves:  II: PERRL 3 mm/brisk. Visual fields full.  III, IV, VI: EOMI without ptosis  V: Sensation is intact to light touch and symmetrical  to face.  VII: Face is asymmetric with mild left mouth droop at rest and less elevation of the left mouth with smiling VIII: Hearing is intact to voice IX, X: Palate elevation is symmetric. Phonation normal.  XI: Normal sternocleidomastoid and trapezius muscle strength XII: Tongue protrudes midline without fasciculations.   Motor: 5/5 strength present in the right upper and lower extremities.  4/5 strength present in the left upper and lower extremities with notable left-sided weakness on strength exam.  There is no vertical drift in bilateral upper or lower extremities on assessment.  Tone is normal. Bulk is normal.  Sensation: Intact to light touch bilaterally in all four extremities. No extinction to DSS present.  Coordination: FTN intact bilaterally. HKS intact bilaterally.   DTRs: 2+ and symmetric biceps, brachioradialis, and patellae Plantar: Toes downgoing bilaterally Gait: Deferred  NIHSS: 1a Level of Conscious.: 0 1b LOC Questions: 0 1c LOC Commands: 0 2 Best Gaze: 0 3 Visual: 0 4 Facial Palsy: 1 5a Motor Arm - left: 0 5b Motor Arm - Right: 0 6a Motor Leg - Left: 0 6b Motor Leg - Right: 0 7 Limb Ataxia: 0 8 Sensory: 0 9 Best Language: 0 10 Dysarthria: 1 11 Extinct. and Inatten.: 0 TOTAL: 2  Labs I have reviewed labs in epic and the results pertinent to this consultation are: CBC    Component Value Date/Time   WBC 9.0 08/26/2020 0739   RBC 4.86 08/26/2020 0739   HGB 15.0 08/26/2020 0747   HCT 44.0 08/26/2020 0747   PLT 338 08/26/2020 0739   MCV 88.3 08/26/2020 0739   MCH 30.9 08/26/2020 0739   MCHC 35.0 08/26/2020 0739   RDW 12.8 08/26/2020 0739   LYMPHSABS 1.8 08/26/2020 0739   MONOABS 0.7 08/26/2020 0739   EOSABS 0.2 08/26/2020 0739   BASOSABS 0.1 08/26/2020 0739   CMP     Component Value Date/Time   NA 140 08/26/2020 0747   K 3.5 08/26/2020 0747   CL 102 08/26/2020 0747   CO2 24 07/25/2018 0454   GLUCOSE 164 (H) 08/26/2020 0747   BUN 7  08/26/2020 0747   CREATININE 0.70 08/26/2020 0747   CALCIUM 9.7 07/25/2018 0454   PROT 7.1 07/25/2018 0454   ALBUMIN 3.7 07/25/2018 0454   AST 30 07/25/2018 0454   ALT 43 07/25/2018 0454   ALKPHOS 79 07/25/2018 0454   BILITOT 0.5 07/25/2018 0454   GFRNONAA >60 07/25/2018 0454   GFRAA >60 07/25/2018 0454   Lipid Panel  No results found for: CHOL, TRIG, HDL, CHOLHDL, VLDL, LDLCALC, LDLDIRECT No results found for: HGBA1C  Imaging I have reviewed the images obtained:  CT-scan of the brain 08/26/2020: Normal noncontrast CT appearance of the brain.  ASPECTS 10.  CT angio head and neck / CT cerebral perfusion 08/26/2020: 1. Positive for 3-4 mm focus of adherent thrombus or soft plaque in the medial Right ICA bulb. No large vessel occlusion, but attenuated appearance of the Right MCA M3 branches corresponds to CT Perfusion finding of 9 mL estimated penumbra in the posterior Right MCA territory. No core infarct detected by CTP. 2. No atherosclerosis elsewhere in the head and neck. Tortuous proximal great vessels.  Assessment: 55 y.o. spanish-speaking male with PMHx of tobacco use, obesity- BMI 40.23,  and essential HTN who presented for evaluation of dysarthria and left-sided weakness since 21:30 last night 08/25/2020.  - Examination reveals patient with mild dysarthria, mild left-sided weakness, and left mouth droop with an initial NIHSS of 2.  - CTH complete without acute intracranial abnormality with an ASPECTS of 10. CT angio head and neck and CT cerebral perfusion revealed a 3-49mm focus of adherent thrombus or soft plaque in the medial RICA bulb but without LVO. Further, there is attenuated appearance of the RMCA M3 branches with a CTP of 22mL penumbra in the posterior RMCA territory without core infarct.  - Presentation most consistent with acute infarct not identified by CT with persistent neurologic deficits since 21:30 on 08/25/2020 and with soft plaque in the RICA.  - tPA not given due to out  thrombolytic time window. Patient not a thrombectomy candidate due to no LVO identified on vessel imaging. Heparin stroke protocol initiated due to adherent thrombus or soft plaque in the medial right ICA bulb.  - Etiology of acute ischemic stroke is most likely thrombolytic from RICA soft plaque that is non-occlusive. Will complete stroke work up.   Impression: Acute ischemic stroke diagnosed by clinical presentation RICA nonadherent thrombus versus soft plaque - likely etiology for ischemia Essential hypertension Dysarthria  Recommendations: - HgbA1c, fasting lipid panel - MRI brain without contrast - Frequent neuro checks q2h - Echocardiogram - Prophylactic therapy- Heparin gtt per stroke protocol, no bolus. Pharmacy consulted for heparin dosing.  - Maintain systolic blood pressure < 180 mmHg - Risk factor modification - Telemetry monitoring - PT consult, OT consult, Speech consult  Lanae Boast, AGAC-NP [facility] Pager: [phone]  Attending addendum Patient seen and examined as an acute code stroke for left-sided weakness and dysarthria. It was activated as an LVO code stroke for aphasia but on my examination below, suspect more dysarthria than aphasia. 55 year old man with hypertension, obesity and tobacco use presented for evaluation of left-sided weakness and slurred speech with last known well somewhere around 9:30 PM.  Woke up this morning with left hand feeling weak and speech being slurred.  He thought it was associated with his blood pressure and took his medication but his symptoms did not show any improvement. He had similar symptoms on June 25 lasted about 15 minutes and resolved. On my examination he is awake alert oriented x3.  His speech is mildly dysarthric, he has mild left lower facial weakness and very subtle left upper and lower extremity weakness without vertical drift.  His NIH stroke scale is 2. I have personally reviewed CT imaging:  Aspects 10 on CT head.  CTA head and neck shows about a 4 mm focus of adherent thrombus or soft plaque in the medial right ICA bulb with no emergent large vessel occlusion but attenuated appearance of the right MCA M3 branches and a corresponding CT perfusion finding of 9 cc of estimated penumbra in the posterior right MCA territory.  No core infarct detected by CTP.  Clinically, current presentation seemed consistent with an acute ischemic stroke, likely of small vessel etiology.  But initially there was some question of aphasia for which I I pursued vessel imaging to rule out large vessel etiology-CTA head and neck which showed a right ICA nonadherent thrombus versus soft plaque, which could be responsible for his current symptoms as well.  There is also hypoattenuation of right MCA M3 branches and CT perfusion images showed a 9 cc penumbra without core. Started on heparin-stroke protocol. Admit for stroke  work-up as above. Stroke neurology will follow with you. Discussed my plan with Dr. Particia Nearing in the ER.  -- Milon Dikes, MD Neurologist [facility] Pager: [phone]  CRITICAL CARE ATTESTATION Performed by: Milon Dikes, MD Total critical care time: Critical care time was exclusive of separately billable procedures and treating other patients and/or supervising APPs/Residents/Students Critical care was necessary to treat or prevent imminent or life-threatening deterioration due to acute ischemic stroke, right internal carotid stenosis. This patient is critically ill and at significant risk for neurological worsening and/or death and care requires constant monitoring. Critical care was time spent personally by me on the following activities: development of treatment plan with patient and/or surrogate as well as nursing, discussions with consultants, evaluation of patient's response to treatment, examination of patient, obtaining history from patient or surrogate,  ordering and performing treatments and interventions, ordering and review of laboratory studies, ordering and review of radiographic studies, pulse oximetry, re-evaluation of patient's condition, participation in multidisciplinary rounds and medical decision making of high complexity in the care of this patient.

## 2020-08-26 NOTE — ED Notes (Signed)
Pt transported to MRI via stretcher at this time.  °

## 2020-08-26 NOTE — ED Provider Notes (Signed)
[facility] EMERGENCY DEPARTMENT Provider Note   CSN: [id number] Arrival date & time: 08/26/20  1517  An emergency department physician performed an initial assessment on this suspected stroke patient at 62.  History Chief Complaint  Patient presents with   Dizziness   Stroke Symptoms    Perry Hughes is a 55 y.o. male.  Pt presents to the ED today with difficulty speaking, swallowing, and speaking.  Pt said he was eating dinner last night around 9 or 9:30 pm.  He had to stop eating because he could not get his mouth to move properly.  Pt said he felt like his left arm and leg were not working correctly.  He tried to go to sleep, but did not sleep well.  This morning, he is speaking better, but still having trouble with the left side of his face.  His left arm and leg still do not feel right.  He is not on blood thinners.  No difficulty seeing.  No hx CVA.   Due to language barrier, an interpreter was present during the history-taking and subsequent discussion (and for part of the physical exam) with this patient.       Past Medical History:  Diagnosis Date   Diabetes mellitus without complication (HCC)    Hypertension     There are no problems to display for this patient.   No past surgical history on file.     No family history on file.  Social History   Tobacco Use   Smoking status: Every Day    Pack years: 0.00   Smokeless tobacco: Never  Substance Use Topics   Alcohol use: Yes   Drug use: No    Home Medications Prior to Admission medications   Medication Sig Start Date End Date Taking? Authorizing Provider  HYDROcodone-acetaminophen (NORCO/VICODIN) 5-325 MG tablet Take 1 tablet by mouth every 4 (four) hours as needed. 07/25/18   Ward, Layla Maw, DO  ibuprofen (ADVIL) 800 MG tablet Take 1 tablet (800 mg total) by mouth every 8 (eight) hours as needed for mild pain. 07/25/18   Ward, Layla Maw, DO  lisinopril-hydrochlorothiazide  (PRINZIDE,ZESTORETIC) 20-25 MG tablet Take 1 tablet by mouth daily. Patient not taking: Reported on 07/25/2018 02/27/17   Faythe Ghee, PA-C  ondansetron (ZOFRAN ODT) 4 MG disintegrating tablet Take 1 tablet (4 mg total) by mouth every 6 (six) hours as needed. 07/25/18   Ward, Layla Maw, DO  tamsulosin (FLOMAX) 0.4 MG CAPS capsule Take 1 capsule (0.4 mg total) by mouth daily. 07/25/18   Ward, Layla Maw, DO    Allergies    Patient has no known allergies.  Review of Systems   Review of Systems  Neurological:        Facial weakness on left.  Left arm and leg weakness.  All other systems reviewed and are negative.  Physical Exam Updated Vital Signs BP (!) 161/105 (BP Location: Right Arm)   Pulse 80   Temp 99.2 F (37.3 C) (Oral)   Resp (!) 22   Ht 5\' 4"  (1.626 m)   Wt 106.3 kg   SpO2 92%   BMI 40.23 kg/m   Physical Exam Vitals and nursing note reviewed.  Constitutional:      Appearance: Normal appearance.  HENT:     Head: Normocephalic and atraumatic.     Right Ear: External ear normal.     Left Ear: External ear normal.     Nose: Nose normal.  Mouth/Throat:     Mouth: Mucous membranes are moist.     Pharynx: Oropharynx is clear.  Eyes:     Extraocular Movements: Extraocular movements intact.     Conjunctiva/sclera: Conjunctivae normal.     Pupils: Pupils are equal, round, and reactive to light.  Cardiovascular:     Rate and Rhythm: Normal rate and regular rhythm.     Pulses: Normal pulses.     Heart sounds: Normal heart sounds.  Pulmonary:     Effort: Pulmonary effort is normal.     Breath sounds: Normal breath sounds.  Abdominal:     General: Abdomen is flat. Bowel sounds are normal.     Palpations: Abdomen is soft.  Musculoskeletal:        General: Normal range of motion.     Cervical back: Normal range of motion and neck supple.  Skin:    General: Skin is warm.     Capillary Refill: Capillary refill takes less than 2 seconds.  Neurological:     Mental  Status: He is alert.     Comments: Mild left facial droop.  Left arm and leg mildly weak.  Psychiatric:        Mood and Affect: Mood normal.        Behavior: Behavior normal.    ED Results / Procedures / Treatments   Labs (all labs ordered are listed, but only abnormal results are displayed) Labs Reviewed  COMPREHENSIVE METABOLIC PANEL - Abnormal; Notable for the following components:      Result Value   Glucose, Bld 164 (*)    All other components within normal limits  I-STAT CHEM 8, ED - Abnormal; Notable for the following components:   Glucose, Bld 164 (*)    All other components within normal limits  CBG MONITORING, ED - Abnormal; Notable for the following components:   Glucose-Capillary 160 (*)    All other components within normal limits  RESP PANEL BY RT-PCR (FLU A&B, COVID) ARPGX2  PROTIME-INR  APTT  CBC  DIFFERENTIAL  ETHANOL  RAPID URINE DRUG SCREEN, HOSP PERFORMED  URINALYSIS, ROUTINE W REFLEX MICROSCOPIC  HEPARIN LEVEL (UNFRACTIONATED)    EKG None  Radiology CT CEREBRAL PERFUSION W CONTRAST  Result Date: 08/26/2020 CLINICAL DATA:  55 year old male code stroke presentation with left side symptoms. EXAM: CT ANGIOGRAPHY HEAD AND NECK CT PERFUSION BRAIN TECHNIQUE: Multidetector CT imaging of the head and neck was performed using the standard protocol during bolus administration of intravenous contrast. Multiplanar CT image reconstructions and MIPs were obtained to evaluate the vascular anatomy. Carotid stenosis measurements (when applicable) are obtained utilizing NASCET criteria, using the distal internal carotid diameter as the denominator. Multiphase CT imaging of the brain was performed following IV bolus contrast injection. Subsequent parametric perfusion maps were calculated using RAPID software. CONTRAST:  OMNIPAQUE IOHEXOL 350 MG/ML SOLN COMPARISON:  Plain head CT 0812 hours today. FINDINGS: CT Brain Perfusion Findings: ASPECTS: 10 CBF (<30%) Volume: 52mL  Perfusion (Tmax>6.0s) volume: 9 mL.  Hypoperfusion index 0.0. Mismatch Volume: 9 mLmL Infarction Location:Ischemia in the posterior right MCA territory. CTA NECK Skeleton: Intermittent carious dentition. No acute osseous abnormality identified. Upper chest: Negative aside from minor atelectasis. Other neck: Negative. Aortic arch: 3 vessel arch configuration. No arch atherosclerosis. Mildly tortuous great vessels. Right carotid system: Mild brachiocephalic and proximal right CCA tortuosity. Negative right CCA. Patent right carotid bifurcation with rounded roughly 3-4 mm area of soft plaque or adherent thrombus at the medial right ICA bulb on series  3, image 116. No other atherosclerosis. And patent right ICA to the skull base without stenosis. Left carotid system: Negative aside from tortuosity. Vertebral arteries: Negative aside from tortuous proximal subclavian arteries, left vertebral artery V1 segment. Codominant vertebrals. CTA HEAD Posterior circulation: Codominant distal vertebral arteries with tortuosity to the vertebrobasilar junction, but no plaque or stenosis. Both PICA origins are patent. Patent basilar artery with mild tortuosity. No stenosis. Patent SCA and PCA origins. Posterior communicating arteries are diminutive or absent. Bilateral PCA branches are within normal limits. Anterior circulation: Both ICA siphons are patent. No siphon plaque or stenosis. Patent carotid termini, MCA and ACA origins. Mildly tortuous ACAs. Diminutive or absent anterior communicating artery. Bilateral ACA branches are within normal limits. Left MCA M1 segment and bifurcation are patent without stenosis. Left MCA branches are within normal limits. Right MCA M1 segment is patent with no stenosis. Right MCA bifurcation is patent without stenosis. No discrete right MCA branch occlusion is identified, although the right M3 branches appear diminutive compared to those on the left. On series 9, image 27 there is subtle  irregularity along the dorsal right M1 which is not correlated on the other 60 series and suspected to be artifact. Venous sinuses: Early contrast timing, grossly patent. Anatomic variants: None. Review of the MIP images confirms the above findings IMPRESSION: 1. Positive for 3-4 mm focus of adherent thrombus or soft plaque in the medial Right ICA bulb. No large vessel occlusion, but attenuated appearance of the Right MCA M3 branches corresponds to CT Perfusion finding of 9 mL estimated penumbra in the posterior Right MCA territory. No core infarct detected by CTP. 2. No atherosclerosis elsewhere in the head and neck. Tortuous proximal great vessels. 3. Study discussed by telephone with Dr. Wilford CornerArora on 08/26/2020 at 0837 hours. Electronically Signed   By: Odessa FlemingH  Hall M.D.   On: 08/26/2020 08:50   CT HEAD CODE STROKE WO CONTRAST  Result Date: 08/26/2020 CLINICAL DATA:  Code stroke. 55 year old male with sudden onset dizziness and weakness at 2200 hours. Slurred speech. EXAM: CT HEAD WITHOUT CONTRAST TECHNIQUE: Contiguous axial images were obtained from the base of the skull through the vertex without intravenous contrast. COMPARISON:  None. FINDINGS: Brain: Normal cerebral volume. No midline shift, ventriculomegaly, mass effect, evidence of mass lesion, intracranial hemorrhage or evidence of cortically based acute infarction. Faint vascular calcifications at the basal ganglia. Gray-white matter differentiation is within normal limits throughout the brain. No encephalomalacia identified. Vascular: No suspicious intracranial vascular hyperdensity. Skull: No acute osseous abnormality identified. Sinuses/Orbits: Mild maxillary alveolar recess mucosal thickening. Otherwise clear. Tympanic cavities and mastoids are clear. Other: No acute orbit or scalp soft tissue finding. ASPECTS [facility]([place] Stroke Program Early CT Score) Total score (0-10 with 10 being normal): 10. IMPRESSION: 1. Normal noncontrast CT appearance of the brain.   ASPECTS 10. 2. These results were communicated to Dr. Wilford CornerArora at 8:16 am on 08/26/2020 by text page via the [facility] messaging system. Electronically Signed   By: Odessa FlemingH  Hall M.D.   On: 08/26/2020 08:16   CT ANGIO HEAD NECK W WO CM (CODE STROKE)  Result Date: 08/26/2020 CLINICAL DATA:  55 year old male code stroke presentation with left side symptoms. EXAM: CT ANGIOGRAPHY HEAD AND NECK CT PERFUSION BRAIN TECHNIQUE: Multidetector CT imaging of the head and neck was performed using the standard protocol during bolus administration of intravenous contrast. Multiplanar CT image reconstructions and MIPs were obtained to evaluate the vascular anatomy. Carotid stenosis measurements (when applicable) are obtained utilizing NASCET criteria,  using the distal internal carotid diameter as the denominator. Multiphase CT imaging of the brain was performed following IV bolus contrast injection. Subsequent parametric perfusion maps were calculated using RAPID software. CONTRAST:  OMNIPAQUE IOHEXOL 350 MG/ML SOLN COMPARISON:  Plain head CT 0812 hours today. FINDINGS: CT Brain Perfusion Findings: ASPECTS: 10 CBF (<30%) Volume: 0mL Perfusion (Tmax>6.0s) volume: 9 mL.  Hypoperfusion index 0.0. Mismatch Volume: 9 mLmL Infarction Location:Ischemia in the posterior right MCA territory. CTA NECK Skeleton: Intermittent carious dentition. No acute osseous abnormality identified. Upper chest: Negative aside from minor atelectasis. Other neck: Negative. Aortic arch: 3 vessel arch configuration. No arch atherosclerosis. Mildly tortuous great vessels. Right carotid system: Mild brachiocephalic and proximal right CCA tortuosity. Negative right CCA. Patent right carotid bifurcation with rounded roughly 3-4 mm area of soft plaque or adherent thrombus at the medial right ICA bulb on series 3, image 116. No other atherosclerosis. And patent right ICA to the skull base without stenosis. Left carotid system: Negative aside from tortuosity. Vertebral  arteries: Negative aside from tortuous proximal subclavian arteries, left vertebral artery V1 segment. Codominant vertebrals. CTA HEAD Posterior circulation: Codominant distal vertebral arteries with tortuosity to the vertebrobasilar junction, but no plaque or stenosis. Both PICA origins are patent. Patent basilar artery with mild tortuosity. No stenosis. Patent SCA and PCA origins. Posterior communicating arteries are diminutive or absent. Bilateral PCA branches are within normal limits. Anterior circulation: Both ICA siphons are patent. No siphon plaque or stenosis. Patent carotid termini, MCA and ACA origins. Mildly tortuous ACAs. Diminutive or absent anterior communicating artery. Bilateral ACA branches are within normal limits. Left MCA M1 segment and bifurcation are patent without stenosis. Left MCA branches are within normal limits. Right MCA M1 segment is patent with no stenosis. Right MCA bifurcation is patent without stenosis. No discrete right MCA branch occlusion is identified, although the right M3 branches appear diminutive compared to those on the left. On series 9, image 27 there is subtle irregularity along the dorsal right M1 which is not correlated on the other 60 series and suspected to be artifact. Venous sinuses: Early contrast timing, grossly patent. Anatomic variants: None. Review of the MIP images confirms the above findings IMPRESSION: 1. Positive for 3-4 mm focus of adherent thrombus or soft plaque in the medial Right ICA bulb. No large vessel occlusion, but attenuated appearance of the Right MCA M3 branches corresponds to CT Perfusion finding of 9 mL estimated penumbra in the posterior Right MCA territory. No core infarct detected by CTP. 2. No atherosclerosis elsewhere in the head and neck. Tortuous proximal great vessels. 3. Study discussed by telephone with Dr. Wilford Corner on 08/26/2020 at 0837 hours. Electronically Signed   By: Odessa Fleming M.D.   On: 08/26/2020 08:50    Procedures Procedures    Medications Ordered in ED Medications  sodium chloride flush (NS) 0.9 % injection 3 mL (has no administration in time range)  heparin ADULT infusion 100 units/mL (25000 units/223mL) (has no administration in time range)  iohexol (OMNIPAQUE) 350 MG/ML injection 100 mL (100 mLs Intravenous Contrast Given 08/26/20 0829)    ED Course  I have reviewed the triage vital signs and the nursing notes.  Pertinent labs & imaging results that were available during my care of the patient were reviewed by me and considered in my medical decision making (see chart for details).    MDM Rules/Calculators/A&P  Code stroke called due to the possible aphasia and numbness/weakness left face and arm.  CT shows a thrombus in the medial right ICA bulb.  Right MCA M3 branch with decreased perfusion.  Neurology started pt on heparin.  No tpa due to time.  Neurology wants medicine admission to SDU.  Pt d/w Dr. Luberta Robertson (triad) who will admit.  CRITICAL CARE Performed by: Jacalyn Lefevre   Total critical care time: 30 minutes  Critical care time was exclusive of separately billable procedures and treating other patients.  Critical care was necessary to treat or prevent imminent or life-threatening deterioration.  Critical care was time spent personally by me on the following activities: development of treatment plan with patient and/or surrogate as well as nursing, discussions with consultants, evaluation of patient's response to treatment, examination of patient, obtaining history from patient or surrogate, ordering and performing treatments and interventions, ordering and review of laboratory studies, ordering and review of radiographic studies, pulse oximetry and re-evaluation of patient's condition.     Final Clinical Impression(s) / ED Diagnoses Final diagnoses:  Cerebrovascular accident (CVA), unspecified mechanism [facility])    Rx / DC Orders ED Discharge Orders     None         Jacalyn Lefevre, MD 08/26/20 [id number]

## 2020-08-26 NOTE — ED Notes (Signed)
The pt reports that he still feels a little dzzy

## 2020-08-26 NOTE — ED Notes (Addendum)
Notified Dr. Particia Nearing of pt and possible need for Code Stroke Activation.

## 2020-08-26 NOTE — Progress Notes (Signed)
ANTICOAGULATION CONSULT NOTE - Initial Consult  Pharmacy Consult for heparin Indication: stroke  No Known Allergies  Patient Measurements: Height: 5\' 4"  (162.6 cm) Weight: 106.3 kg (234 lb 5.6 oz) IBW/kg (Calculated) : 59.2 Heparin Dosing Weight: 83 kg  Vital Signs: Temp: 97.8 F (36.6 C) (07/02 1608) Temp Source: Oral (07/02 0736) BP: 171/128 (07/02 1800) Pulse Rate: 66 (07/02 1800)  Labs: Recent Labs    08/26/20 0739 08/26/20 0747 08/26/20 1748  HGB 15.0 15.0  --   HCT 42.9 44.0  --   PLT 338  --   --   APTT 32  --   --   LABPROT 13.5  --   --   INR 1.0  --   --   HEPARINUNFRC  --   --  <0.10*  CREATININE 0.84 0.70  --      Estimated Creatinine Clearance: 116.5 mL/min (by C-G formula based on SCr of 0.7 mg/dL).  Assessment: CC/HPI: 55 yo m presenting with left-sided weakness and slurred speech - outside TPA window  PMH: HTN  Anticoag: none pta - iv hep for stroke   Neuro: nonocclusive thrombus in R ICA with corresponding stroke in R MCA  Renal: SCr 0.7; Heme/Onc: H&H 15/44, plt 338 6-hour heparin level resulting undetectable.   Goal of Therapy:  Heparin level 0.3 - 0.5 units/ml Monitor platelets by anticoagulation protocol: Yes   Plan:  Increase Heparin gtt to 1200 units/hr (no bolus d/t stroke) Initial HL 1700 (0.3 - 0.5) Re-check level in 6 hours Daily hep lvl cbc F/U neuro plans, length of heparin  57, PharmD, BCPS 08/26/2020 7:08 PM ED Clinical Pharmacist -  [phone]

## 2020-08-26 NOTE — H&P (Addendum)
History and Physical:    Perry Hughes   QQP:[id number] DOB: [birth year] DOA: 08/26/2020  Referring MD/provider: Dr. Particia Nearing PCP: Georgina Quint, MD   Patient coming from: Home  Chief Complaint: Cannot move mouth and left upper and lower extremity weakness  History of Present Illness:   Perry Hughes is an 55 y.o. male with PMH significant for HTN was in his usual state of health until yesterday when he realized that he did not know how to move his mouth when he was eating.  Apparently his mouth would not close over his fork properly.  He also noted slurred speech as did his wife.  He went to bed hoping that it would get better but in the morning he had left lower extremity and upper extremity weakness so came to the ED  ED Course:  The patient was noted to have a facial droop, slurred speech and weakness of left arm and leg.  Code stroke was called but he was not deemed to be a candidate given last known normal was last night.  CTA of head and neck showed a 4 mm thrombus at right ICA.  Also had right MCA M3 attenuation with stroke.  Plan was for treatment with heparin per neurology.  They request [facility] hospitalist to admit the patient.  ROS:   ROS   Review of Systems: General: Denies fever, chills, malaise,  Eyes: Denies recent change in vision, no discharge, redness, pain noted Endocrine: Denies heat/cold intolerance, polyuria or weight loss. Respiratory: Denies cough, SOB at rest or hemoptysis Cardiovascular: Denies chest pain or palpitations GI: Denies nausea, vomiting, diarrhea or constipation GU: Denies dysuria, frequency or hematuria Blood/lymphatics: Denies easy bruising or bleeding Mood/affect: Denies anxiety/depression    Past Medical History:   Past Medical History:  Diagnosis Date   Diabetes mellitus without complication (HCC)    Hypertension     Past Surgical History:   No past surgical history on file.  Social History:   Social History    Socioeconomic History   Marital status: Married    Spouse name: Not on file   Number of children: Not on file   Years of education: Not on file   Highest education level: Not on file  Occupational History   Not on file  Tobacco Use   Smoking status: Every Day    Pack years: 0.00   Smokeless tobacco: Never  Substance and Sexual Activity   Alcohol use: Yes   Drug use: No   Sexual activity: Not on file  Other Topics Concern   Not on file  Social History Narrative   Not on file   Social Determinants of Health   Financial Resource Strain: Not on file  Food Insecurity: Not on file  Transportation Needs: Not on file  Physical Activity: Not on file  Stress: Not on file  Social Connections: Not on file  Intimate Partner Violence: Not on file    Allergies   Patient has no known allergies.  Family history:   No family history on file.  Current Medications:   Prior to Admission medications   Medication Sig Start Date End Date Taking? Authorizing Provider  HYDROcodone-acetaminophen (NORCO/VICODIN) 5-325 MG tablet Take 1 tablet by mouth every 4 (four) hours as needed. 07/25/18   Ward, Layla Maw, DO  ibuprofen (ADVIL) 800 MG tablet Take 1 tablet (800 mg total) by mouth every 8 (eight) hours as needed for mild pain. 07/25/18   Ward, Layla Maw, DO  lisinopril-hydrochlorothiazide (  PRINZIDE,ZESTORETIC) 20-25 MG tablet Take 1 tablet by mouth daily. Patient not taking: Reported on 07/25/2018 02/27/17   Faythe Ghee, PA-C  ondansetron (ZOFRAN ODT) 4 MG disintegrating tablet Take 1 tablet (4 mg total) by mouth every 6 (six) hours as needed. 07/25/18   Ward, Layla Maw, DO  tamsulosin (FLOMAX) 0.4 MG CAPS capsule Take 1 capsule (0.4 mg total) by mouth daily. 07/25/18   Ward, Layla Maw, DO    Physical Exam:   Vitals:   08/26/20 0915 08/26/20 0945 08/26/20 1000 08/26/20 1015  BP: (!) 159/96 (!) 165/98 (!) 162/102 (!) 148/99  Pulse: 75 79 76 (!) 58  Resp: 17 15 (!) 21 16  Temp:       TempSrc:      SpO2: 95% 96% 95%   Weight:      Height:         Physical Exam: Blood pressure (!) 148/99, pulse (!) 58, temperature 99.2 F (37.3 C), temperature source Oral, resp. rate 16, height 5\' 4"  (1.626 m), weight 106.3 kg, SpO2 95 %. Gen: Patient lying in bed with attentive wife at bedside.  Wife is fluent in . Eyes: sclera anicteric, conjuctiva mildly injected bilaterally CVS: S1-S2, regulary, no gallops Respiratory:  decreased air entry likely secondary to decreased inspiratory effort GI: NABS, soft, NT  LE: No edema. No cyanosis Neuro: A/O x 3, patient has left facial droop.  He has some dysarthria.  He also seems to have some buccal facial apraxia.  Left upper and lower extremities with 4 out of 5 strength.  Other extremities 5 out of 5. Psych: patient is logical and coherent, judgement and insight appear normal, mood and affect appropriate to situation. Skin: no rashes or lesions or ulcers,    Data Review:    Labs: Basic Metabolic Panel: Recent Labs  Lab 08/26/20 0739 08/26/20 0747  NA 137 140  K 3.5 3.5  CL 103 102  CO2 26  --   GLUCOSE 164* 164*  BUN 7 7  CREATININE 0.84 0.70  CALCIUM 8.9  --    Liver Function Tests: Recent Labs  Lab 08/26/20 0739  AST 19  ALT 29  ALKPHOS 82  BILITOT 0.6  PROT 6.9  ALBUMIN 3.7   No results for input(s): LIPASE, AMYLASE in the last 168 hours. No results for input(s): AMMONIA in the last 168 hours. CBC: Recent Labs  Lab 08/26/20 0739 08/26/20 0747  WBC 9.0  --   NEUTROABS 6.3  --   HGB 15.0 15.0  HCT 42.9 44.0  MCV 88.3  --   PLT 338  --    Cardiac Enzymes: No results for input(s): CKTOTAL, CKMB, CKMBINDEX, TROPONINI in the last 168 hours.  BNP (last 3 results) No results for input(s): PROBNP in the last 8760 hours. CBG: Recent Labs  Lab 08/26/20 0743  GLUCAP 160*    Urinalysis    Component Value Date/Time   COLORURINE STRAW (A) 07/25/2018 0439   APPEARANCEUR CLEAR 07/25/2018 0439    LABSPEC 1.005 07/25/2018 0439   PHURINE 6.0 07/25/2018 0439   GLUCOSEU NEGATIVE 07/25/2018 0439   HGBUR NEGATIVE 07/25/2018 0439   BILIRUBINUR NEGATIVE 07/25/2018 0439   KETONESUR NEGATIVE 07/25/2018 0439   PROTEINUR NEGATIVE 07/25/2018 0439   NITRITE NEGATIVE 07/25/2018 0439   LEUKOCYTESUR NEGATIVE 07/25/2018 0439      Radiographic Studies: CT CEREBRAL PERFUSION W CONTRAST  Result Date: 08/26/2020 CLINICAL DATA:  55 year old male code stroke presentation with left side symptoms. EXAM: CT ANGIOGRAPHY HEAD  AND NECK CT PERFUSION BRAIN TECHNIQUE: Multidetector CT imaging of the head and neck was performed using the standard protocol during bolus administration of intravenous contrast. Multiplanar CT image reconstructions and MIPs were obtained to evaluate the vascular anatomy. Carotid stenosis measurements (when applicable) are obtained utilizing NASCET criteria, using the distal internal carotid diameter as the denominator. Multiphase CT imaging of the brain was performed following IV bolus contrast injection. Subsequent parametric perfusion maps were calculated using RAPID software. CONTRAST:  100mL OMNIPAQUE IOHEXOL 350 MG/ML SOLN COMPARISON:  Plain head CT 0812 hours today. FINDINGS: CT Brain Perfusion Findings: ASPECTS: 10 CBF (<30%) Volume: 0mL Perfusion (Tmax>6.0s) volume: 9 mL.  Hypoperfusion index 0.0. Mismatch Volume: 9 mLmL Infarction Location:Ischemia in the posterior right MCA territory. CTA NECK Skeleton: Intermittent carious dentition. No acute osseous abnormality identified. Upper chest: Negative aside from minor atelectasis. Other neck: Negative. Aortic arch: 3 vessel arch configuration. No arch atherosclerosis. Mildly tortuous great vessels. Right carotid system: Mild brachiocephalic and proximal right CCA tortuosity. Negative right CCA. Patent right carotid bifurcation with rounded roughly 3-4 mm area of soft plaque or adherent thrombus at the medial right ICA bulb on series 3, image  116. No other atherosclerosis. And patent right ICA to the skull base without stenosis. Left carotid system: Negative aside from tortuosity. Vertebral arteries: Negative aside from tortuous proximal subclavian arteries, left vertebral artery V1 segment. Codominant vertebrals. CTA HEAD Posterior circulation: Codominant distal vertebral arteries with tortuosity to the vertebrobasilar junction, but no plaque or stenosis. Both PICA origins are patent. Patent basilar artery with mild tortuosity. No stenosis. Patent SCA and PCA origins. Posterior communicating arteries are diminutive or absent. Bilateral PCA branches are within normal limits. Anterior circulation: Both ICA siphons are patent. No siphon plaque or stenosis. Patent carotid termini, MCA and ACA origins. Mildly tortuous ACAs. Diminutive or absent anterior communicating artery. Bilateral ACA branches are within normal limits. Left MCA M1 segment and bifurcation are patent without stenosis. Left MCA branches are within normal limits. Right MCA M1 segment is patent with no stenosis. Right MCA bifurcation is patent without stenosis. No discrete right MCA branch occlusion is identified, although the right M3 branches appear diminutive compared to those on the left. On series 9, image 27 there is subtle irregularity along the dorsal right M1 which is not correlated on the other 60 series and suspected to be artifact. Venous sinuses: Early contrast timing, grossly patent. Anatomic variants: None. Review of the MIP images confirms the above findings IMPRESSION: 1. Positive for 3-4 mm focus of adherent thrombus or soft plaque in the medial Right ICA bulb. No large vessel occlusion, but attenuated appearance of the Right MCA M3 branches corresponds to CT Perfusion finding of 9 mL estimated penumbra in the posterior Right MCA territory. No core infarct detected by CTP. 2. No atherosclerosis elsewhere in the head and neck. Tortuous proximal great vessels. 3. Study  discussed by telephone with Dr. Wilford CornerArora on 08/26/2020 at 0837 hours. Electronically Signed   By: Odessa FlemingH  Hall M.D.   On: 08/26/2020 08:50   CT HEAD CODE STROKE WO CONTRAST  Result Date: 08/26/2020 CLINICAL DATA:  Code stroke. 55 year old male with sudden onset dizziness and weakness at 2200 hours. Slurred speech. EXAM: CT HEAD WITHOUT CONTRAST TECHNIQUE: Contiguous axial images were obtained from the base of the skull through the vertex without intravenous contrast. COMPARISON:  None. FINDINGS: Brain: Normal cerebral volume. No midline shift, ventriculomegaly, mass effect, evidence of mass lesion, intracranial hemorrhage or evidence of cortically based acute  infarction. Faint vascular calcifications at the basal ganglia. Gray-white matter differentiation is within normal limits throughout the brain. No encephalomalacia identified. Vascular: No suspicious intracranial vascular hyperdensity. Skull: No acute osseous abnormality identified. Sinuses/Orbits: Mild maxillary alveolar recess mucosal thickening. Otherwise clear. Tympanic cavities and mastoids are clear. Other: No acute orbit or scalp soft tissue finding. ASPECTS [facility] Stroke Program Early CT Score) Total score (0-10 with 10 being normal): 10. IMPRESSION: 1. Normal noncontrast CT appearance of the brain.  ASPECTS 10. 2. These results were communicated to Dr. Wilford Corner at 8:16 am on 08/26/2020 by text page via the [facility] messaging system. Electronically Signed   By: Odessa Fleming M.D.   On: 08/26/2020 08:16   CT ANGIO HEAD NECK W WO CM (CODE STROKE)  Result Date: 08/26/2020 CLINICAL DATA:  55 year old male code stroke presentation with left side symptoms. EXAM: CT ANGIOGRAPHY HEAD AND NECK CT PERFUSION BRAIN TECHNIQUE: Multidetector CT imaging of the head and neck was performed using the standard protocol during bolus administration of intravenous contrast. Multiplanar CT image reconstructions and MIPs were obtained to evaluate the vascular anatomy. Carotid stenosis  measurements (when applicable) are obtained utilizing NASCET criteria, using the distal internal carotid diameter as the denominator. Multiphase CT imaging of the brain was performed following IV bolus contrast injection. Subsequent parametric perfusion maps were calculated using RAPID software. CONTRAST:  OMNIPAQUE IOHEXOL 350 MG/ML SOLN COMPARISON:  Plain head CT 0812 hours today. FINDINGS: CT Brain Perfusion Findings: ASPECTS: 10 CBF (<30%) Volume: 21mL Perfusion (Tmax>6.0s) volume: 9 mL.  Hypoperfusion index 0.0. Mismatch Volume: 9 mLmL Infarction Location:Ischemia in the posterior right MCA territory. CTA NECK Skeleton: Intermittent carious dentition. No acute osseous abnormality identified. Upper chest: Negative aside from minor atelectasis. Other neck: Negative. Aortic arch: 3 vessel arch configuration. No arch atherosclerosis. Mildly tortuous great vessels. Right carotid system: Mild brachiocephalic and proximal right CCA tortuosity. Negative right CCA. Patent right carotid bifurcation with rounded roughly 3-4 mm area of soft plaque or adherent thrombus at the medial right ICA bulb on series 3, image 116. No other atherosclerosis. And patent right ICA to the skull base without stenosis. Left carotid system: Negative aside from tortuosity. Vertebral arteries: Negative aside from tortuous proximal subclavian arteries, left vertebral artery V1 segment. Codominant vertebrals. CTA HEAD Posterior circulation: Codominant distal vertebral arteries with tortuosity to the vertebrobasilar junction, but no plaque or stenosis. Both PICA origins are patent. Patent basilar artery with mild tortuosity. No stenosis. Patent SCA and PCA origins. Posterior communicating arteries are diminutive or absent. Bilateral PCA branches are within normal limits. Anterior circulation: Both ICA siphons are patent. No siphon plaque or stenosis. Patent carotid termini, MCA and ACA origins. Mildly tortuous ACAs. Diminutive or absent  anterior communicating artery. Bilateral ACA branches are within normal limits. Left MCA M1 segment and bifurcation are patent without stenosis. Left MCA branches are within normal limits. Right MCA M1 segment is patent with no stenosis. Right MCA bifurcation is patent without stenosis. No discrete right MCA branch occlusion is identified, although the right M3 branches appear diminutive compared to those on the left. On series 9, image 27 there is subtle irregularity along the dorsal right M1 which is not correlated on the other 60 series and suspected to be artifact. Venous sinuses: Early contrast timing, grossly patent. Anatomic variants: None. Review of the MIP images confirms the above findings IMPRESSION: 1. Positive for 3-4 mm focus of adherent thrombus or soft plaque in the medial Right ICA bulb. No large vessel occlusion,  but attenuated appearance of the Right MCA M3 branches corresponds to CT Perfusion finding of 9 mL estimated penumbra in the posterior Right MCA territory. No core infarct detected by CTP. 2. No atherosclerosis elsewhere in the head and neck. Tortuous proximal great vessels. 3. Study discussed by telephone with Dr. Wilford Corner on 08/26/2020 at 0837 hours. Electronically Signed   By: Odessa Fleming M.D.   On: 08/26/2020 08:50    EKG: Independently reviewed.  Sinus rhythm at 90.  Normal intervals.  LAD at -40.  Nonspecific ST-T wave changes.   Assessment/Plan:   Active Problems:   Stroke (HCC)   HTN (hypertension)   Stroke Patient with nonocclusive thrombus in R ICA with corresponding stroke in R MCA. Neurology has seen patient and started on stroke protocol heparin drip Patient to be admitted to neuro stepdown unit Permissive hypertension for SBP less than 180 Stroke work-up including MRI brain and echocardiogram ordered PT/OT and speech consulted  Of note, patient notes red stool since he has been eating a lot of beets over the past week.  Denies history of GI bleed in the past.   Denied abdominal pain.  We will send stool guaiac but I have extremely low suspicion for GI bleed.  HTN Hold antihypertensives for now  Elevated blood sugar Patient is not on antidiabetic medications per wife Hemoglobin A1c for the morning Most recent blood sugar here is 160 SSI AC at bedtime can be ordered if hemoglobin A1c is elevated.      Other information:   DVT prophylaxis: Heparin drip ordered. Code Status: Full Family Communication: Patient's wife was at bedside throughout Disposition Plan: Home Consults called: Neurology Admission status: Inpatient  Abdulhamid Olgin Tublu Rhyse Loux [facility]  If 7PM-7AM, please contact night-coverage [email or web address]

## 2020-08-26 NOTE — ED Notes (Signed)
Wife at  The bedside  The pt falls asleep  Snoring loudly

## 2020-08-26 NOTE — Progress Notes (Signed)
ANTICOAGULATION CONSULT NOTE - Initial Consult  Pharmacy Consult for heparin Indication: stroke  No Known Allergies  Patient Measurements: Height: 5\' 4"  (162.6 cm) Weight: 106.3 kg (234 lb 5.6 oz) IBW/kg (Calculated) : 59.2 Heparin Dosing Weight: 83 kg  Vital Signs: Temp: 99.2 F (37.3 C) (07/02 0736) Temp Source: Oral (07/02 0736) BP: 161/105 (07/02 0834) Pulse Rate: 80 (07/02 0834)  Labs: Recent Labs    08/26/20 0739 08/26/20 0747  HGB 15.0 15.0  HCT 42.9 44.0  PLT 338  --   APTT 32  --   LABPROT 13.5  --   INR 1.0  --   CREATININE 0.84 0.70    Estimated Creatinine Clearance: 116.5 mL/min (by C-G formula based on SCr of 0.7 mg/dL).  Assessment: CC/HPI: 55 yo m presenting with left-sided weakness and slurred speech - outside TPA window  PMH: HTN  Anticoag: none pta - iv hep for stroke   Neuro: possible RICA soft plaque embolization  Renal: SCr 0.7 :  Heme/Onc: H&H 15/44, plt 338  Goal of Therapy:  Heparin level 0.3 - 0.5 units/ml Monitor platelets by anticoagulation protocol: Yes   Plan:  Heparin gtt 1000 units/hr (no bolus d/t stroke) Initial HL 1700 (0.3 - 0.5) Daily hep lvl cbc F/U neuro plans, length of heparin  57, PharmD, BCCCP Clinical Pharmacist [phone]  Please check AMION for all [facility] Pharmacy numbers  08/26/2020 8:57 AM

## 2020-08-26 NOTE — ED Triage Notes (Signed)
Spanish speaking pt. Wife with pt (speaks [place])- reports sudden onset of dizziness and generalized weakness since 10pm last night.  Pt has L arm drift and L sided facial droop.  Wife states pt's speech is slurred.  Reports 1 hour episode last night where speech was very slurred and difficulty to understand.  States speech has improved but not back to normal.  Pt able to name objects and follow commands.

## 2020-08-27 ENCOUNTER — Inpatient Hospital Stay (HOSPITAL_COMMUNITY): Payer: Self-pay

## 2020-08-27 ENCOUNTER — Encounter (HOSPITAL_COMMUNITY): Payer: Self-pay | Admitting: Internal Medicine

## 2020-08-27 DIAGNOSIS — E785 Hyperlipidemia, unspecified: Secondary | ICD-10-CM

## 2020-08-27 DIAGNOSIS — I6389 Other cerebral infarction: Secondary | ICD-10-CM

## 2020-08-27 DIAGNOSIS — F17200 Nicotine dependence, unspecified, uncomplicated: Secondary | ICD-10-CM

## 2020-08-27 DIAGNOSIS — Z6841 Body Mass Index (BMI) 40.0 and over, adult: Secondary | ICD-10-CM

## 2020-08-27 DIAGNOSIS — E119 Type 2 diabetes mellitus without complications: Secondary | ICD-10-CM

## 2020-08-27 LAB — CBC
HCT: 42.6 % (ref 39.0–52.0)
Hemoglobin: 14.7 g/dL (ref 13.0–17.0)
MCH: 30.4 pg (ref 26.0–34.0)
MCHC: 34.5 g/dL (ref 30.0–36.0)
MCV: 88.2 fL (ref 80.0–100.0)
Platelets: 307 10*3/uL (ref 150–400)
RBC: 4.83 MIL/uL (ref 4.22–5.81)
RDW: 12.7 % (ref 11.5–15.5)
WBC: 8.9 10*3/uL (ref 4.0–10.5)
nRBC: 0 % (ref 0.0–0.2)

## 2020-08-27 LAB — LIPID PANEL
Cholesterol: 201 mg/dL — ABNORMAL HIGH (ref 0–200)
HDL: 29 mg/dL — ABNORMAL LOW (ref >40)
LDL Cholesterol: 145 mg/dL — ABNORMAL HIGH (ref 0–99)
Total CHOL/HDL Ratio: 6.9 RATIO
Triglycerides: 136 mg/dL (ref <150)
VLDL: 27 mg/dL (ref 0–40)

## 2020-08-27 LAB — ECHOCARDIOGRAM COMPLETE BUBBLE STUDY
Area-P 1/2: 3.3 cm2
S' Lateral: 4.4 cm

## 2020-08-27 LAB — HEPARIN LEVEL (UNFRACTIONATED)
Heparin Unfractionated: 0.17 IU/mL — ABNORMAL LOW (ref 0.30–0.70)
Heparin Unfractionated: 0.31 IU/mL (ref 0.30–0.70)
Heparin Unfractionated: <0.1 IU/mL — ABNORMAL LOW (ref 0.30–0.70)

## 2020-08-27 LAB — HEMOGLOBIN A1C
Hgb A1c MFr Bld: 6.9 % — ABNORMAL HIGH (ref 4.8–5.6)
Mean Plasma Glucose: 151.33 mg/dL

## 2020-08-27 MED ORDER — ATORVASTATIN CALCIUM 80 MG PO TABS
80.0000 mg | ORAL_TABLET | Freq: Every evening | ORAL | Status: DC
  Administered 2020-08-27 – 2020-08-28 (×2): 80 mg via ORAL
  Filled 2020-08-27 (×2): qty 1

## 2020-08-27 MED ORDER — HYDROXYZINE HCL 25 MG PO TABS
25.0000 mg | ORAL_TABLET | Freq: Three times a day (TID) | ORAL | Status: DC | PRN
  Administered 2020-08-27: 25 mg via ORAL
  Filled 2020-08-27: qty 1

## 2020-08-27 MED ORDER — ATORVASTATIN CALCIUM 40 MG PO TABS: 40.0000 mg | ORAL_TABLET | Freq: Every evening | ORAL | Status: DC

## 2020-08-27 NOTE — Evaluation (Signed)
Clinical/Bedside Swallow Evaluation Patient Details  Name: Perry Hughes MRN: [id number] Date of Birth: [birth year]  Today's Date: 08/27/2020 Time: SLP Start Time (ACUTE ONLY): 1053 SLP Stop Time (ACUTE ONLY): 1113 SLP Time Calculation (min) (ACUTE ONLY): 20 min  Past Medical History:  Past Medical History:  Diagnosis Date   Diabetes mellitus without complication (HCC)    Hypertension    Past Surgical History: No past surgical history on file. HPI:  55 y.o. male, Spanish-speaking, presented to ED 7/02 for evaluation of left-sided weakness and slurred speech. MRI: Several small acute infarcts of the right MCA territory involvingfrontoparietal lobes and insula. Suspect small area of thrombus  within a distal M2/M3 branch. PMHx significant for essential hypertension, obesity- BMI 40.23, and tobacco use.   Assessment / Plan / Recommendation Clinical Impression  Pt was evaluated with the assistance of video interpretation services ([employer] [phone]).  He was participatory and demonstrated understanding of some basic English. He demonstrated no dysphagia. There was mild left facial asymmetry. Tongue midline. Sensation intact.  Dentition present.  Demonstrated active mastication, brisk swallow response, and no s/s of aspiration.  Recommend resuming a regular diet, thin liquids; meds whole in water. No SLP f/u is needed. Conveyed recs to RN and MD. SLP Visit Diagnosis: Dysphagia, unspecified (R13.10)    Aspiration Risk  No limitations    Diet Recommendation   Regular solids, thin liquids  Medication Administration: Whole meds with liquid    Other  Recommendations Oral Care Recommendations: Oral care BID   Follow up Recommendations None      Frequency and Duration            Prognosis        Swallow Study   General Date of Onset: 08/26/20 HPI: 55 y.o. male, Spanish-speaking, presented to ED 7/02 for evaluation of left-sided weakness and slurred speech. MRI: Several small acute  infarcts of the right MCA territory involvingfrontoparietal lobes and insula. Suspect small area of thrombus  within a distal M2/M3 branch. PMHx significant for essential hypertension, obesity- BMI 40.23, and tobacco use. Type of Study: Bedside Swallow Evaluation Diet Prior to this Study: NPO Temperature Spikes Noted: No Respiratory Status: Room air History of Recent Intubation: No Behavior/Cognition: Alert;Cooperative Oral Cavity Assessment: Within Functional Limits Oral Care Completed by SLP: No Oral Cavity - Dentition: Adequate natural dentition Vision: Functional for self-feeding Self-Feeding Abilities: Able to feed self Patient Positioning: Upright in bed Baseline Vocal Quality: Normal Volitional Cough: Strong Volitional Swallow: Able to elicit    Oral/Motor/Sensory Function Overall Oral Motor/Sensory Function: Mild impairment Facial Symmetry: Abnormal symmetry left;Suspected CN VII (facial) dysfunction   Ice Chips Ice chips: Within functional limits   Thin Liquid Thin Liquid: Within functional limits    Nectar Thick Nectar Thick Liquid: Not tested   Honey Thick Honey Thick Liquid: Not tested   Puree Puree: Within functional limits   Solid     Solid: Within functional limits     Perry Nauert L. Samson Frederic, MA CCC/SLP Acute Rehabilitation Services Office number [phone] Pager [phone]  Perry Hughes 08/27/2020,11:39 AM

## 2020-08-27 NOTE — Evaluation (Signed)
Speech Language Pathology Evaluation Patient Details Name: Perry Hughes MRN: [id number] DOB: 28-Apr-1965 Today's Date: 08/27/2020 Time: 3419-6222 SLP Time Calculation (min) (ACUTE ONLY): 20 min  Problem List:  Patient Active Problem List   Diagnosis Date Noted   Stroke (HCC) 08/26/2020   HTN (hypertension) 08/26/2020   Past Medical History:  Past Medical History:  Diagnosis Date   Diabetes mellitus without complication (HCC)    Hypertension    Past Surgical History: No past surgical history on file. HPI:  55 y.o. male, Spanish-speaking, presented to ED 7/02 for evaluation of left-sided weakness and slurred speech. MRI: Several small acute infarcts of the right MCA territory involvingfrontoparietal lobes and insula. Suspect small area of thrombus  within a distal M2/M3 branch. PMHx significant for essential hypertension, obesity- BMI 40.23, and tobacco use.   Assessment / Plan / Recommendation Clinical Impression  Pt was evaluated with the assistance of video interpretation services (Perry Hughes).  He participated in the SLUMS, achieving a score of 28/30, WNL. He demonstrated excellent attention, working memory, Wellsite geologist.  Awareness was WNL. He had appropriate questions about his stroke.  Expressive/receptive language were WFL.  Speech clarity was good and dysarthria was improving per his report.  He was fully intelligible.  No SLP f/u is needed. Pt agreed.    SLP Assessment  SLP Recommendation/Assessment: Patient does not need any further Speech Lanaguage Pathology Services SLP Visit Diagnosis: Cognitive communication deficit (R41.841)    Follow Up Recommendations  None    Frequency and Duration           SLP Evaluation Cognition  Overall Cognitive Status: Within Functional Limits for tasks assessed Arousal/Alertness: Awake/alert Orientation Level: Oriented X4 Attention: Selective Selective Attention: Appears intact Memory: Appears intact Awareness:  Appears intact Problem Solving: Appears intact Safety/Judgment: Appears intact       Comprehension  Auditory Comprehension Overall Auditory Comprehension: Appears within functional limits for tasks assessed Reading Comprehension Reading Status: Not tested    Expression Expression Primary Mode of Expression: Verbal Verbal Expression Overall Verbal Expression: Appears within functional limits for tasks assessed Written Expression Dominant Hand: Right Written Expression: Not tested   Oral / Motor  Oral Motor/Sensory Function Overall Oral Motor/Sensory Function: Mild impairment Facial Symmetry: Abnormal symmetry left;Suspected CN VII (facial) dysfunction Motor Speech Overall Motor Speech: Appears within functional limits for tasks assessed   GO            [name], MA CCC/SLP Acute Rehabilitation Services Office number [phone] Pager [phone]         [name] 08/27/2020, 11:50 AM

## 2020-08-27 NOTE — Progress Notes (Signed)
ANTICOAGULATION CONSULT NOTE   Pharmacy Consult for heparin Indication: stroke  No Known Allergies  Patient Measurements: Height: 5\' 4"  (162.6 cm) Weight: 108.3 kg (238 lb 12.1 oz) IBW/kg (Calculated) : 59.2 Heparin Dosing Weight: 83 kg  Vital Signs: Temp: 97.9 F (36.6 C) (07/02 2320) Temp Source: Oral (07/02 2320) BP: 151/96 (07/02 2338) Pulse Rate: 82 (07/03 0029)  Labs: Recent Labs    08/26/20 0739 08/26/20 0747 08/26/20 1748 08/27/20 0009  HGB 15.0 15.0  --  14.7  HCT 42.9 44.0  --  42.6  PLT 338  --   --  307  APTT 32  --   --   --   LABPROT 13.5  --   --   --   INR 1.0  --   --   --   HEPARINUNFRC  --   --  <0.10* <0.10*  CREATININE 0.84 0.70  --   --      Estimated Creatinine Clearance: 117.7 mL/min (by C-G formula based on SCr of 0.7 mg/dL).  Assessment: CC/HPI: 55 yo m presenting with left-sided weakness and slurred speech - outside TPA window  PMH: HTN  Anticoag: none pta - iv hep for stroke   Neuro: nonocclusive thrombus in R ICA with corresponding stroke in R MCA  Renal: SCr 0.7; Heme/Onc: H&H 15/44, plt 338 6-hour heparin level resulting undetectable.   7/3 AM update:  Heparin level remains low CBC stable  Goal of Therapy:  Heparin level 0.3 - 0.5 units/ml Monitor platelets by anticoagulation protocol: Yes   Plan:  No boluses Inc heparin to 1450 units/hr 0900 heparin level F/U neuro plans, length of heparin  57, PharmD, BCPS Clinical Pharmacist Phone: [phone]

## 2020-08-27 NOTE — Progress Notes (Signed)
PROGRESS NOTE    Perry Hughes  XHB:[id number] DOB: [birth year] DOA: 08/26/2020 PCP: Georgina Quint, MD    Chief Complaint  Patient presents with   Dizziness   Stroke Symptoms    Brief Narrative:   Perry Hughes is an 55 y.o. male with PMH significant for HTN was in his usual state of health until yesterday when he realized that he did not know how to move his mouth when he was eating.  Apparently his mouth would not close over his fork properly.  He also noted slurred speech as did his wife.  He went to bed hoping that it would get better but in the morning he had left lower extremity and upper extremity weakness so came to the ED   ED Course:  The patient was noted to have a facial droop, slurred speech and weakness of left arm and leg.  Code stroke was called but he was not deemed to be a candidate given last known normal was last night.  CTA of head and neck showed a 4 mm thrombus at right ICA.  Also had right MCA M3 attenuation with stroke.  Plan was for treatment with heparin per neurology.  They request [facility] to admit the patient.  Assessment & Plan:   Active Problems:   Stroke (HCC)   HTN (hypertension)   Acute CVA -Patient with mild dysarthria, mild left side weakness, - Neurology input greatly appreciated CT cerebral perfusion revealed a 3-29mm focus of adherent thrombus or soft plaque in the medial RICA bulb but without LVO, . Patient not a thrombectomy candidate due to no LVO identified on vessel imaging. Heparin stroke protocol initiated due to adherent thrombus or soft plaque in the medial right ICA bulb. -2D echo is pending -LDL is elevated Hemoglobin A1c is elevated at 6.9 -We will defer antiplatelet therapy up to neurology, patient is currently on heparin GTT -PT/OT/SLP following  HTN Hold antihypertensives for now   Diabetes mellitus -Hemoglobin A1c is 6.9, which is diagnostic of diabetes mellitus -We will keep on insulin sliding  scale.   Hyperlipidemia -LDL is 158, on Lipitor  DVT prophylaxis: Heparin GTT Code Status: Full Family Communication: None at bedside, patient was seen via tele interpreter Disposition:   Status is: Inpatient  Remains inpatient appropriate because:IV treatments appropriate due to intensity of illness or inability to take PO  Dispo: The patient is from: Home              Anticipated d/c is to: Home              Patient currently is not medically stable to d/c.   Difficult to place patient No       Consultants:  Neurology   Subjective:  Patient denies any worsening of his focal deficits  Objective: Vitals:   08/27/20 0238 08/27/20 0420 08/27/20 0624 08/27/20 0819  BP:  (!) 142/85 (!) 151/68 (!) 162/80  Pulse: 82 73 63 68  Resp: 19 18 18 20   Temp:  97.9 F (36.6 C) 98 F (36.7 C) 98 F (36.7 C)  TempSrc:  Oral Oral Oral  SpO2: 97% 97% 97% 91%  Weight:      Height:        Intake/Output Summary (Last 24 hours) at 08/27/2020 1228 Last data filed at 08/27/2020 0700 Gross per 24 hour  Intake 229.16 ml  Output 200 ml  Net 29.16 ml   Filed Weights   08/26/20 0849 08/26/20 2114  Weight: 106.3 kg  108.3 kg    Examination:  Awake Alert, Oriented X 3, she is slow, remains with mild left-sided weakness Symmetrical Chest wall movement, Good air movement bilaterally, CTAB RRR,No Gallops,Rubs or new Murmurs, No Parasternal Heave +ve B.Sounds, Abd Soft, No tenderness, No rebound - guarding or rigidity. No Cyanosis, Clubbing or edema, No new Rash or bruise     Data Reviewed: I have personally reviewed following labs and imaging studies  CBC: Recent Labs  Lab 08/26/20 0739 08/26/20 0747 08/27/20 0009  WBC 9.0  --  8.9  NEUTROABS 6.3  --   --   HGB 15.0 15.0 14.7  HCT 42.9 44.0 42.6  MCV 88.3  --  88.2  PLT 338  --  307    Basic Metabolic Panel: Recent Labs  Lab 08/26/20 0739 08/26/20 0747  NA 137 140  K 3.5 3.5  CL 103 102  CO2 26  --   GLUCOSE 164*  164*  BUN 7 7  CREATININE 0.84 0.70  CALCIUM 8.9  --     GFR: Estimated Creatinine Clearance: 117.7 mL/min (by C-G formula based on SCr of 0.7 mg/dL).  Liver Function Tests: Recent Labs  Lab 08/26/20 0739  AST 19  ALT 29  ALKPHOS 82  BILITOT 0.6  PROT 6.9  ALBUMIN 3.7    CBG: Recent Labs  Lab 08/26/20 0743  GLUCAP 160*     Recent Results (from the past 240 hour(s))  Resp Panel by RT-PCR (Flu A&B, Covid) Nasopharyngeal Swab     Status: None   Collection Time: 08/26/20  9:57 AM   Specimen: Nasopharyngeal Swab; Nasopharyngeal(NP) swabs in vial transport medium  Result Value Ref Range Status   SARS Coronavirus 2 by RT PCR NEGATIVE NEGATIVE Final    Comment: (NOTE) SARS-CoV-2 target nucleic acids are NOT DETECTED.  The SARS-CoV-2 RNA is generally detectable in upper respiratory specimens during the acute phase of infection. The lowest concentration of SARS-CoV-2 viral copies this assay can detect is 138 copies/mL. A negative result does not preclude SARS-Cov-2 infection and should not be used as the sole basis for treatment or other patient management decisions. A negative result may occur with  improper specimen collection/handling, submission of specimen other than nasopharyngeal swab, presence of viral mutation(s) within the areas targeted by this assay, and inadequate number of viral copies(<138 copies/mL). A negative result must be combined with clinical observations, patient history, and epidemiological information. The expected result is Negative.  Fact Sheet for Patients:  BloggerCourse.com  Fact Sheet for Healthcare Providers:  SeriousBroker.it  This test is no t yet approved or cleared by the [place] FDA and  has been authorized for detection and/or diagnosis of SARS-CoV-2 by FDA under an Emergency Use Authorization (EUA). This EUA will remain  in effect (meaning this test can be used) for the  duration of the COVID-19 declaration under Section 564(b)(1) of the Act, 21 U.S.C.section 360bbb-3(b)(1), unless the authorization is terminated  or revoked sooner.       Influenza A by PCR NEGATIVE NEGATIVE Final   Influenza B by PCR NEGATIVE NEGATIVE Final    Comment: (NOTE) The Xpert Xpress SARS-CoV-2/FLU/RSV plus assay is intended as an aid in the diagnosis of influenza from Nasopharyngeal swab specimens and should not be used as a sole basis for treatment. Nasal washings and aspirates are unacceptable for Xpert Xpress SARS-CoV-2/FLU/RSV testing.  Fact Sheet for Patients: BloggerCourse.com  Fact Sheet for Healthcare Providers: SeriousBroker.it  This test is not yet approved or cleared by  the [employer] and has been authorized for detection and/or diagnosis of SARS-CoV-2 by FDA under an Emergency Use Authorization (EUA). This EUA will remain in effect (meaning this test can be used) for the duration of the COVID-19 declaration under Section 564(b)(1) of the Act, 21 U.S.C. section 360bbb-3(b)(1), unless the authorization is terminated or revoked.  Performed at [id number], [address], [place], [place] [address]          Radiology Studies: MR BRAIN WO CONTRAST  Result Date: 08/26/2020 CLINICAL DATA:  Difficulty speaking and swallowing EXAM: MRI HEAD WITHOUT CONTRAST TECHNIQUE: Multiplanar, multiecho pulse sequences of the brain and surrounding structures were obtained without intravenous contrast. COMPARISON:  Correlation made with recent CT imaging FINDINGS: Brain: Several small foci of reduced diffusion are present with involvement of the right frontoparietal lobes and right insula. No evidence of intracranial hemorrhage. Ventricles and sulci are normal in size and configuration. Patchy foci of T2 hyperintensity in the supratentorial white matter are nonspecific but may reflect mild chronic microvascular  ischemic changes. There is no intracranial mass or mass effect. No hydrocephalus or extra-axial collection. Vascular: Curvilinear susceptibility likely along a distal M2/M3 MCA branch probably reflecting thrombus. Major vessel flow voids at the skull base are preserved. Skull and upper cervical spine: Normal marrow signal is preserved. Sinuses/Orbits: Paranasal sinuses are aerated. Orbits are unremarkable. Other: Sella is unremarkable.  Mastoid air cells are clear. IMPRESSION: Several small acute infarcts of the right MCA territory involving frontoparietal lobes and insula. Suspect small area of thrombus within a distal M2/M3 branch. Mild chronic microvascular ischemic changes. Electronically Signed   By: Guadlupe Spanish M.D.   On: 08/26/2020 11:22   CT CEREBRAL PERFUSION W CONTRAST  Result Date: 08/26/2020 CLINICAL DATA:  55 year old male code stroke presentation with left side symptoms. EXAM: CT ANGIOGRAPHY HEAD AND NECK CT PERFUSION BRAIN TECHNIQUE: Multidetector CT imaging of the head and neck was performed using the standard protocol during bolus administration of intravenous contrast. Multiplanar CT image reconstructions and MIPs were obtained to evaluate the vascular anatomy. Carotid stenosis measurements (when applicable) are obtained utilizing NASCET criteria, using the distal internal carotid diameter as the denominator. Multiphase CT imaging of the brain was performed following IV bolus contrast injection. Subsequent parametric perfusion maps were calculated using RAPID software. CONTRAST:  OMNIPAQUE IOHEXOL 350 MG/ML SOLN COMPARISON:  Plain head CT 0812 hours today. FINDINGS: CT Brain Perfusion Findings: ASPECTS: 10 CBF (<30%) Volume: 79mL Perfusion (Tmax>6.0s) volume: 9 mL.  Hypoperfusion index 0.0. Mismatch Volume: 9 mLmL Infarction Location:Ischemia in the posterior right MCA territory. CTA NECK Skeleton: Intermittent carious dentition. No acute osseous abnormality identified. Upper chest:  Negative aside from minor atelectasis. Other neck: Negative. Aortic arch: 3 vessel arch configuration. No arch atherosclerosis. Mildly tortuous great vessels. Right carotid system: Mild brachiocephalic and proximal right CCA tortuosity. Negative right CCA. Patent right carotid bifurcation with rounded roughly 3-4 mm area of soft plaque or adherent thrombus at the medial right ICA bulb on series 3, image 116. No other atherosclerosis. And patent right ICA to the skull base without stenosis. Left carotid system: Negative aside from tortuosity. Vertebral arteries: Negative aside from tortuous proximal subclavian arteries, left vertebral artery V1 segment. Codominant vertebrals. CTA HEAD Posterior circulation: Codominant distal vertebral arteries with tortuosity to the vertebrobasilar junction, but no plaque or stenosis. Both PICA origins are patent. Patent basilar artery with mild tortuosity. No stenosis. Patent SCA and PCA origins. Posterior communicating arteries are diminutive or absent.  Bilateral PCA branches are within normal limits. Anterior circulation: Both ICA siphons are patent. No siphon plaque or stenosis. Patent carotid termini, MCA and ACA origins. Mildly tortuous ACAs. Diminutive or absent anterior communicating artery. Bilateral ACA branches are within normal limits. Left MCA M1 segment and bifurcation are patent without stenosis. Left MCA branches are within normal limits. Right MCA M1 segment is patent with no stenosis. Right MCA bifurcation is patent without stenosis. No discrete right MCA branch occlusion is identified, although the right M3 branches appear diminutive compared to those on the left. On series 9, image 27 there is subtle irregularity along the dorsal right M1 which is not correlated on the other 60 series and suspected to be artifact. Venous sinuses: Early contrast timing, grossly patent. Anatomic variants: None. Review of the MIP images confirms the above findings IMPRESSION: 1.  Positive for 3-4 mm focus of adherent thrombus or soft plaque in the medial Right ICA bulb. No large vessel occlusion, but attenuated appearance of the Right MCA M3 branches corresponds to CT Perfusion finding of 9 mL estimated penumbra in the posterior Right MCA territory. No core infarct detected by CTP. 2. No atherosclerosis elsewhere in the head and neck. Tortuous proximal great vessels. 3. Study discussed by telephone with Dr. Wilford CornerArora on 08/26/2020 at 0837 hours. Electronically Signed   By: Odessa FlemingH  Hall M.D.   On: 08/26/2020 08:50   DG CHEST PORT 1 VIEW  Result Date: 08/27/2020 CLINICAL DATA:  Hypoxia EXAM: PORTABLE CHEST 1 VIEW COMPARISON:  None. FINDINGS: The heart size and mediastinal contours are within normal limits. Both lungs are clear. The visualized skeletal structures are unremarkable. IMPRESSION: No active disease. Electronically Signed   By: Deatra RobinsonKevin  Herman M.D.   On: 08/27/2020 00:32   CT HEAD CODE STROKE WO CONTRAST  Result Date: 08/26/2020 CLINICAL DATA:  Code stroke. 55 year old male with sudden onset dizziness and weakness at 2200 hours. Slurred speech. EXAM: CT HEAD WITHOUT CONTRAST TECHNIQUE: Contiguous axial images were obtained from the base of the skull through the vertex without intravenous contrast. COMPARISON:  None. FINDINGS: Brain: Normal cerebral volume. No midline shift, ventriculomegaly, mass effect, evidence of mass lesion, intracranial hemorrhage or evidence of cortically based acute infarction. Faint vascular calcifications at the basal ganglia. Gray-white matter differentiation is within normal limits throughout the brain. No encephalomalacia identified. Vascular: No suspicious intracranial vascular hyperdensity. Skull: No acute osseous abnormality identified. Sinuses/Orbits: Mild maxillary alveolar recess mucosal thickening. Otherwise clear. Tympanic cavities and mastoids are clear. Other: No acute orbit or scalp soft tissue finding. ASPECTS [facility]([place] Stroke Program Early CT Score)  Total score (0-10 with 10 being normal): 10. IMPRESSION: 1. Normal noncontrast CT appearance of the brain.  ASPECTS 10. 2. These results were communicated to Dr. Wilford CornerArora at 8:16 am on 08/26/2020 by text page via the [facility] messaging system. Electronically Signed   By: Odessa FlemingH  Hall M.D.   On: 08/26/2020 08:16   CT ANGIO HEAD NECK W WO CM (CODE STROKE)  Result Date: 08/26/2020 CLINICAL DATA:  55 year old male code stroke presentation with left side symptoms. EXAM: CT ANGIOGRAPHY HEAD AND NECK CT PERFUSION BRAIN TECHNIQUE: Multidetector CT imaging of the head and neck was performed using the standard protocol during bolus administration of intravenous contrast. Multiplanar CT image reconstructions and MIPs were obtained to evaluate the vascular anatomy. Carotid stenosis measurements (when applicable) are obtained utilizing NASCET criteria, using the distal internal carotid diameter as the denominator. Multiphase CT imaging of the brain was performed following IV bolus contrast  injection. Subsequent parametric perfusion maps were calculated using RAPID software. CONTRAST:  OMNIPAQUE IOHEXOL 350 MG/ML SOLN COMPARISON:  Plain head CT 0812 hours today. FINDINGS: CT Brain Perfusion Findings: ASPECTS: 10 CBF (<30%) Volume: 47mL Perfusion (Tmax>6.0s) volume: 9 mL.  Hypoperfusion index 0.0. Mismatch Volume: 9 mLmL Infarction Location:Ischemia in the posterior right MCA territory. CTA NECK Skeleton: Intermittent carious dentition. No acute osseous abnormality identified. Upper chest: Negative aside from minor atelectasis. Other neck: Negative. Aortic arch: 3 vessel arch configuration. No arch atherosclerosis. Mildly tortuous great vessels. Right carotid system: Mild brachiocephalic and proximal right CCA tortuosity. Negative right CCA. Patent right carotid bifurcation with rounded roughly 3-4 mm area of soft plaque or adherent thrombus at the medial right ICA bulb on series 3, image 116. No other atherosclerosis. And patent right  ICA to the skull base without stenosis. Left carotid system: Negative aside from tortuosity. Vertebral arteries: Negative aside from tortuous proximal subclavian arteries, left vertebral artery V1 segment. Codominant vertebrals. CTA HEAD Posterior circulation: Codominant distal vertebral arteries with tortuosity to the vertebrobasilar junction, but no plaque or stenosis. Both PICA origins are patent. Patent basilar artery with mild tortuosity. No stenosis. Patent SCA and PCA origins. Posterior communicating arteries are diminutive or absent. Bilateral PCA branches are within normal limits. Anterior circulation: Both ICA siphons are patent. No siphon plaque or stenosis. Patent carotid termini, MCA and ACA origins. Mildly tortuous ACAs. Diminutive or absent anterior communicating artery. Bilateral ACA branches are within normal limits. Left MCA M1 segment and bifurcation are patent without stenosis. Left MCA branches are within normal limits. Right MCA M1 segment is patent with no stenosis. Right MCA bifurcation is patent without stenosis. No discrete right MCA branch occlusion is identified, although the right M3 branches appear diminutive compared to those on the left. On series 9, image 27 there is subtle irregularity along the dorsal right M1 which is not correlated on the other 60 series and suspected to be artifact. Venous sinuses: Early contrast timing, grossly patent. Anatomic variants: None. Review of the MIP images confirms the above findings IMPRESSION: 1. Positive for 3-4 mm focus of adherent thrombus or soft plaque in the medial Right ICA bulb. No large vessel occlusion, but attenuated appearance of the Right MCA M3 branches corresponds to CT Perfusion finding of 9 mL estimated penumbra in the posterior Right MCA territory. No core infarct detected by CTP. 2. No atherosclerosis elsewhere in the head and neck. Tortuous proximal great vessels. 3. Study discussed by telephone with Dr. Wilford Corner on 08/26/2020 at  0837 hours. Electronically Signed   By: Odessa Fleming M.D.   On: 08/26/2020 08:50        Scheduled Meds:   stroke: mapping our early stages of recovery book   Does not apply Once   sodium chloride flush  3 mL Intravenous Once   Continuous Infusions:  heparin 1,650 Units/hr (08/27/20 1052)     LOS: 1 day      Huey Bienenstock, MD [facility]   To contact the attending provider between 7A-7P or the covering provider during after hours 7P-7A, please log into the web site [email or web address] and access using universal [place] password for that web site. If you do not have the password, please call the hospital operator.  08/27/2020, 12:28 PM

## 2020-08-27 NOTE — Progress Notes (Signed)
ANTICOAGULATION CONSULT NOTE   Pharmacy Consult for heparin Indication: stroke  No Known Allergies  Patient Measurements: Height: 5\' 4"  (162.6 cm) Weight: 108.3 kg (238 lb 12.1 oz) IBW/kg (Calculated) : 59.2 Heparin Dosing Weight: 84 kg  Vital Signs: Temp: 98 F (36.7 C) (07/03 1628) Temp Source: Oral (07/03 1628) BP: 154/102 (07/03 1628) Pulse Rate: 90 (07/03 1628)  Labs: Recent Labs    08/26/20 0739 08/26/20 0747 08/26/20 1748 08/27/20 0009 08/27/20 0828 08/27/20 1627  HGB 15.0 15.0  --  14.7  --   --   HCT 42.9 44.0  --  42.6  --   --   PLT 338  --   --  307  --   --   APTT 32  --   --   --   --   --   LABPROT 13.5  --   --   --   --   --   INR 1.0  --   --   --   --   --   HEPARINUNFRC  --   --    < > <0.10* 0.17* 0.31  CREATININE 0.84 0.70  --   --   --   --    < > = values in this interval not displayed.     Estimated Creatinine Clearance: 117.7 mL/min (by C-G formula based on SCr of 0.7 mg/dL).  Assessment: 54 yo m presenting with left-sided weakness and slurred speech found to have nonocclusive thrombus in R ICA with corresponding stroke in R MCA (outside TPA window). PMH significant for HTN. No anticoagulation PTA.   Heparin level tonight came back therapeutic at 0.31, on 1650 units/hr. No s/sx of bleeding or infusion issues.   Goal of Therapy:  Heparin level 0.3 - 0.5 units/ml Monitor platelets by anticoagulation protocol: Yes   Plan:  Continue heparin infusion at 1650 units/hr Monitor daily HL, CBC, and for s/sx of bleeding  F/U neuro plans, length of heparin  57, PharmD, BCCCP Clinical Pharmacist  Phone: [phone] 08/27/2020 5:46 PM  Please check AMION for all [facility] Pharmacy phone numbers After 10:00 PM, call Main Pharmacy [phone]

## 2020-08-27 NOTE — Evaluation (Signed)
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 Suspect small area of thrombus  within a distal M2/M3 branch. Pt had L sided weakness and slurred speech/difficulty swallowing. PMHx: HTN, DM   Clinical Impression   Pt PTA: Pt working and living independently with family. Pt currently,performing all OOB ADL at sink and mobilizing in room well with no physical assist. Pt reports fatigue as the bipap was no comfortable last night and he wants more rest. Pt speaks some English, but his spouse was present to translate. Pt with no LOB episodes and simulating tub transfer with no difficulty. All symptoms have resolved per pt. Pt at functional baseline for ADL. Pt does not require continued OT skilled services. OT signing off.    Follow Up Recommendations  No OT follow up    Equipment Recommendations  None recommended by OT    Recommendations for Other Services       Precautions / Restrictions Precautions Precautions: Fall Restrictions Weight Bearing Restrictions: No      Mobility Bed Mobility Overal bed mobility: Modified Independent                  Transfers Overall transfer level: Needs assistance Equipment used: None Transfers: Sit to/from Stand;Stand Pivot Transfers Sit to Stand: Supervision Stand pivot transfers: Supervision       General transfer comment: supervisionA    Balance Overall balance assessment: Mild deficits observed, not formally tested                                         ADL either performed or assessed with clinical judgement   ADL Overall ADL's : Modified independent                                       General ADL Comments: Pt performing all OOB ADL at sink and mobilizing in  room well with no physical assist. Pt reports fatigue as the bipap was no comfortable last night and he wants more rest. Pt speaks some English, but his spouse was present to translate. Pt with no LOB episodes and simulating tub transfer with no difficulty.     Vision Baseline Vision/History: No visual deficits Patient Visual Report: No change from baseline Vision Assessment?: Yes Eye Alignment: Within Functional Limits Ocular Range of Motion: Within Functional Limits Alignment/Gaze Preference: Within Defined Limits Tracking/Visual Pursuits: Able to track stimulus in all quads without difficulty     Perception     Praxis      Pertinent Vitals/Pain Pain Assessment: No/denies pain     Hand Dominance Right   Extremity/Trunk Assessment Upper Extremity Assessment Upper Extremity Assessment: Overall WFL for tasks assessed;RUE deficits/detail;LUE deficits/detail RUE Deficits / Details: strength 5/5 RUE Sensation: WNL RUE Coordination: WNL LUE Deficits / Details: strength 5/5 LUE Sensation: WNL LUE Coordination: WNL   Lower Extremity Assessment Lower Extremity Assessment: Overall WFL for tasks assessed;Defer to PT evaluation LLE Deficits / Details: 4/5   Cervical / Trunk Assessment Cervical / Trunk Assessment: Normal   Communication Communication Communication: Prefers language other than English (Wife present in room to translate. She reports  pt can understand most spoken Ritta Slot but has difficulty speaking [place].)   Cognition Arousal/Alertness: Awake/alert Behavior During Therapy: WFL for tasks assessed/performed Overall Cognitive Status: Within Functional Limits for tasks assessed                                     General Comments  Spouse in room    Exercises     Shoulder Instructions      Home Living Family/patient expects to be discharged to:: Private residence Living Arrangements: Spouse/significant other;Children Available Help at Discharge:  Family;Available PRN/intermittently Type of Home: House Home Access: Stairs to enter Entrance Stairs-Number of Steps: 3 Entrance Stairs-Rails: Right Home Layout: Two level;Bed/bath upstairs Alternate Level Stairs-Number of Steps: flight Alternate Level Stairs-Rails: Left Bathroom Shower/Tub: Producer, television/film/video: Standard     Home Equipment: None          Prior Functioning/Environment Level of Independence: Independent        Comments: works as a Landscape architect Problem List:        OT Treatment/Interventions:      OT Goals(Current goals can be found in the care plan section) Acute Rehab OT Goals Patient Stated Goal: not stated by pt, home per wife OT Goal Formulation: All assessment and education complete, DC therapy Potential to Achieve Goals: Good  OT Frequency:     Barriers to D/C:            Co-evaluation              AM-PAC OT "6 Clicks" Daily Activity     Outcome Measure Help from another person eating meals?: None Help from another person taking care of personal grooming?: None Help from another person toileting, which includes using toliet, bedpan, or urinal?: None Help from another person bathing (including washing, rinsing, drying)?: None Help from another person to put on and taking off regular upper body clothing?: None Help from another person to put on and taking off regular lower body clothing?: None 6 Click Score: 24   End of Session Nurse Communication: Mobility status  Activity Tolerance: Patient tolerated treatment well Patient left: in bed;with call bell/phone within reach;with family/visitor present  OT Visit Diagnosis: Unsteadiness on feet (R26.81)                Time: 0177-9390 OT Time Calculation (min): 20 min Charges:  OT General Charges $OT Visit: 1 Visit OT Evaluation $OT Eval Moderate Complexity: 1 Mod Flora Lipps, OTR/L Acute Rehabilitation Services Pager: [phone] Office:  [phone]   Mirna Sutcliffe  C 08/27/2020, 2:08 PM

## 2020-08-27 NOTE — Progress Notes (Signed)
STROKE TEAM PROGRESS NOTE   INTERVAL HISTORY His interpretor (Spanish speaking only) is at the bedside. He only c/o a mild h/a at this time. I reviewed plan for heparin gtt and MRI findings of scattered stroke in R MCA from the R ICA thrombus. I answered questions for him as well as described stroke work up, plan.   Vitals:   08/27/20 0238 08/27/20 0420 08/27/20 0624 08/27/20 0819  BP:  (!) 142/85 (!) 151/68 (!) 162/80  Pulse: 82 73 63 68  Resp: 19 18 18 20   Temp:  97.9 F (36.6 C) 98 F (36.7 C) 98 F (36.7 C)  TempSrc:  Oral Oral Oral  SpO2: 97% 97% 97% 91%  Weight:      Height:       CBC:  Recent Labs  Lab 08/26/20 0739 08/26/20 0747 08/27/20 0009  WBC 9.0  --  8.9  NEUTROABS 6.3  --   --   HGB 15.0 15.0 14.7  HCT 42.9 44.0 42.6  MCV 88.3  --  88.2  PLT 338  --  307   Basic Metabolic Panel:  Recent Labs  Lab 08/26/20 0739 08/26/20 0747  NA 137 140  K 3.5 3.5  CL 103 102  CO2 26  --   GLUCOSE 164* 164*  BUN 7 7  CREATININE 0.84 0.70  CALCIUM 8.9  --    Lipid Panel:  Recent Labs  Lab 08/27/20 0009  CHOL 201*  TRIG 136  HDL 29*  CHOLHDL 6.9  VLDL 27  LDLCALC 10/28/20*   HgbA1c:  Recent Labs  Lab 08/27/20 0009  HGBA1C 6.9*   Urine Drug Screen:  Recent Labs  Lab 08/26/20 0957  LABOPIA NONE DETECTED  COCAINSCRNUR NONE DETECTED  LABBENZ NONE DETECTED  AMPHETMU NONE DETECTED  THCU NONE DETECTED  LABBARB NONE DETECTED    Alcohol Level  Recent Labs  Lab 08/26/20 0801  ETH <10    IMAGING past 24 hours DG CHEST PORT 1 VIEW  Result Date: 08/27/2020 CLINICAL DATA:  Hypoxia EXAM: PORTABLE CHEST 1 VIEW COMPARISON:  None. FINDINGS: The heart size and mediastinal contours are within normal limits. Both lungs are clear. The visualized skeletal structures are unremarkable. IMPRESSION: No active disease. Electronically Signed   By: 10/28/2020 M.D.   On: 08/27/2020 00:32    PHYSICAL EXAM General: Appears well-developed, mild distress. Psych: Affect  appropriate to situation Eyes: No scleral injection HENT: No OP obstrucion Head: Normocephalic.  Cardiovascular: Normal rate and regular rhythm.  Respiratory: Effort normal and breath sounds normal to anterior ascultation GI: Soft.  No distension. There is no tenderness.  Skin: WDI    Neurological Examination Mental Status: Alert, oriented, thought content appropriate.  Speech fluent without evidence of aphasia. Slight dysarthria noted by interpreter and pt agrees that his speech is still not quite normal. Able to follow 3 step commands without difficulty. Cranial Nerves: II: Visual fields grossly normal,  III,IV, VI: ptosis not present, extra-ocular motions intact bilaterally, pupils equal, round, reactive to light and accommodation V,VII: left facial droop noted, facial light touch sensation normal bilaterally VIII: hearing normal bilaterally IX,X: uvula rises symmetrically XI: bilateral shoulder shrug XII: midline tongue extension Motor: Right : Upper extremity   5/5    Left:     Upper extremity   5/5  Lower extremity   5/5     Lower extremity   5/5 Tone and bulk:normal tone throughout; no atrophy noted Sensory: Pinprick and light touch intact throughout, bilaterally Deep  Tendon Reflexes: 2+ and symmetric throughout Plantars: Right: downgoing   Left: downgoing Cerebellar: normal finger-to-nose, normal rapid alternating movements and normal heel-to-shin test Gait: normal gait and station   ASSESSMENT/PLAN Mr. Perry Hughes is a 55 y.o. male with history of tobacco use, obesity- BMI 40.23, and essential HTN who presented for evaluation of dysarthria and left-sided weakness that started the night PTA.  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  Code Stroke CT head No acute abnormality. ASPECTS 10.    CTA head & neck: 3-4 mm focus of adherent thrombus or soft plaque in the medial Right ICA bulb. No large vessel occlusion, but attenuated appearance of the Right MCA  M3 branches corresponds to CT Perfusion finding of 9 mL estimated penumbra in the posterior Right MCA territory. No core infarct detected by CTP. No atherosclerosis elsewhere in the head and neck. Tortuous proximal great vessels. MRI  Several small acute infarcts of the right MCA territory involving frontoparietal lobes and insula. Suspect small area of thrombus within a distal M2/M3 branch. 2D Echo pending LDL 145 HgbA1c 6.9 VTE prophylaxis - On heparin gtt    Diet   Diet regular Room service appropriate? Yes with Assist; Fluid consistency: Thin   none  prior to admission, now on heparin IV. Plan to transition to [facility] pending work up and insurance status.  Therapy recommendations:  pending Disposition:  pending  Hypertension Home meds:  Zestril Stable Permissive hypertension (OK if < 220/120) but gradually normalize in 5-7 days Long-term BP goal normotensive  Hyperlipidemia Home meds:  none,  LDL 145, goal < 70 Add Lipitor 80mg  po daily  High intensity statin  Continue statin at discharge  Diabetes type II Controlled Home meds:  glucaphage HgbA1c 6.9, goal < 7.0 CBGs Recent Labs    08/26/20 0743  GLUCAP 160*    SSI  Other Stroke Risk Factors Cigarette smoke; advised to stop smoking Obesity, Body mass index is 40.98 kg/m., BMI >/= 30 associated with increased stroke risk, recommend weight loss, diet and exercise as appropriate  Hx stroke/TIA Family hx stroke  Obstructive sleep apnea; but has not been formally diagnosed- recommend out pt sleep study.  Inactivity and high fat diet- reviewed heart healthy exercise and diet with pt  Hospital day # 1  Perry Hughes, ARNP-C, ANVP-BC Pager: [phone]   To contact Stroke Continuity provider, please refer to [phone]. After hours, contact General Neurology

## 2020-08-27 NOTE — Progress Notes (Signed)
  Echocardiogram 2D Echocardiogram has been performed.  Delcie Roch 08/27/2020, 2:55 PM

## 2020-08-27 NOTE — Evaluation (Signed)
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 Suspect small area of thrombus  within a distal M2/M3 branch. Pt had L sided weakness and slurred speech/difficulty swallowing. PMHx: HTN, DM   Clinical Impression  Pt admitted with above diagnosis. PTA pt lived at home with family, independent working as a Education administrator. On eval, he required min guard assist transfers and ambulation 250' pushing IV pole vs no AD.  Pt currently with functional limitations due to the deficits listed below (see PT Problem List). Pt will benefit from skilled PT to increase their independence and safety with mobility to allow discharge to the venue listed below.       Follow Up Recommendations Outpatient PT    Equipment Recommendations  None recommended by PT    Recommendations for Other Services       Precautions / Restrictions Precautions Precautions: Fall      Mobility  Bed Mobility Overal bed mobility: Modified Independent                  Transfers Overall transfer level: Needs assistance Equipment used: None Transfers: Sit to/from Stand;Stand Pivot Transfers Sit to Stand: Min guard Stand pivot transfers: Min guard       General transfer comment: min guard for safety and lines  Ambulation/Gait Ambulation/Gait assistance: Min guard Gait Distance (Feet): 250 Feet Assistive device: IV Pole Gait Pattern/deviations: Step-through pattern Gait velocity: WFL Gait velocity interpretation: >2.62 ft/sec, indicative of community ambulatory General Gait Details: min guard for safety, no LOB noted  Stairs            Wheelchair Mobility    Modified Rankin (Stroke Patients Only) Modified Rankin (Stroke Patients Only) Pre-Morbid Rankin Score: No symptoms Modified  Rankin: Slight disability     Balance Overall balance assessment: Mild deficits observed, not formally tested                                           Pertinent Vitals/Pain Pain Assessment: No/denies pain    Home Living Family/patient expects to be discharged to:: Private residence Living Arrangements: Spouse/significant other;Children Available Help at Discharge: Family;Available PRN/intermittently Type of Home: House Home Access: Stairs to enter Entrance Stairs-Rails: Right Entrance Stairs-Number of Steps: 3 Home Layout: Two level;Bed/bath upstairs Home Equipment: None      Prior Function Level of Independence: Independent         Comments: works as a Web designer Extremity Assessment Upper Extremity Assessment: Defer to OT evaluation    Lower Extremity Assessment Lower Extremity Assessment: LLE deficits/detail LLE Deficits / Details: 4/5    Cervical / Trunk Assessment Cervical / Trunk Assessment: Normal  Communication   Communication: Prefers language other than English (Wife present in room to translate. She reports pt can understand most spoken Ritta Slot but has difficulty speaking [place].)  Cognition Arousal/Alertness: Awake/alert Behavior During Therapy: WFL for tasks assessed/performed Overall Cognitive Status: Within Functional Limits for tasks assessed  General Comments      Exercises     Assessment/Plan    PT Assessment Patient needs continued PT services  PT Problem List Decreased strength;Decreased mobility;Decreased balance       PT Treatment Interventions Therapeutic activities;Gait training;Therapeutic exercise;Patient/family education;Stair training;Balance training;Functional mobility training    PT Goals (Current goals can be found in the Care Plan section)  Acute Rehab PT Goals Patient Stated Goal:  not stated by pt, home per wife PT Goal Formulation: With patient/family Time For Goal Achievement: 09/10/20 Potential to Achieve Goals: Good    Frequency Min 4X/week   Barriers to discharge        Co-evaluation               AM-PAC PT "6 Clicks" Mobility  Outcome Measure Help needed turning from your back to your side while in a flat bed without using bedrails?: None Help needed moving from lying on your back to sitting on the side of a flat bed without using bedrails?: None Help needed moving to and from a bed to a chair (including a wheelchair)?: A Little Help needed standing up from a chair using your arms (e.g., wheelchair or bedside chair)?: A Little Help needed to walk in hospital room?: A Little Help needed climbing 3-5 steps with a railing? : A Little 6 Click Score: 20    End of Session Equipment Utilized During Treatment: Gait belt Activity Tolerance: Patient tolerated treatment well Patient left: in bed;with call bell/phone within reach;with family/visitor present Nurse Communication: Mobility status PT Visit Diagnosis: Unsteadiness on feet (R26.81);Muscle weakness (generalized) (M62.81)    Time: 6269-4854 PT Time Calculation (min) (ACUTE ONLY): 22 min   Charges:   PT Evaluation $PT Eval Moderate Complexity: 1 Mod          Aida Raider, PT  Office # [phone] Pager [phone]   Ilda Foil 08/27/2020, 11:46 AM

## 2020-08-27 NOTE — Progress Notes (Signed)
Patient placed on CPAP qhs per order around midnight. Called to the room per RN around 2am that patient is desatting. Upon my arrival to patient room noted decrease inspiratory effort with frequent bouts of periods of apnea lasting 10-15seconds. Multiple apnea events noted upon inspection assessment. Periods of apnea were accompanied w/ O2 desaturations. With saturations in the low 80's. Patient was taken off the "white box" NIV machine and placed on a V60 BiPAP and placed on a rate. Settings are in the flowsheet. Patient still has irregular respirations while sleeping but once on the V60 NIV machine on a rate, no desaturations were noted. Patient appears to be a little more comfortable but unresolved irregular breathing pattern.  Learned from reviewing patient chart that patient has recently had TIA/CVA related symptoms associated w/ facial dropping, slurred speech, and left-sided weakness. Suggest recommendation for outpatient sleep study.     Carlisle Torgeson L. Heran Campau, BS, RRT-ACCS, RCP 08/27/2020, [phone]

## 2020-08-27 NOTE — Progress Notes (Signed)
Patient is on NIV at this time tolerating it fairly well.  

## 2020-08-27 NOTE — Progress Notes (Signed)
ANTICOAGULATION CONSULT NOTE   Pharmacy Consult for heparin Indication: stroke  No Known Allergies  Patient Measurements: Height: 5\' 4"  (162.6 cm) Weight: 108.3 kg (238 lb 12.1 oz) IBW/kg (Calculated) : 59.2 Heparin Dosing Weight: 84 kg  Vital Signs: Temp: 98 F (36.7 C) (07/03 0624) Temp Source: Oral (07/03 0624) BP: 151/68 (07/03 0624) Pulse Rate: 63 (07/03 0624)  Labs: Recent Labs    08/26/20 0739 08/26/20 0747 08/26/20 1748 08/27/20 0009  HGB 15.0 15.0  --  14.7  HCT 42.9 44.0  --  42.6  PLT 338  --   --  307  APTT 32  --   --   --   LABPROT 13.5  --   --   --   INR 1.0  --   --   --   HEPARINUNFRC  --   --  <0.10* <0.10*  CREATININE 0.84 0.70  --   --     Estimated Creatinine Clearance: 117.7 mL/min (by C-G formula based on SCr of 0.7 mg/dL).  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 PMH significant for HTN. No anticoagulation PTA.   Previous two heparin levels undetectable. 7.5 hour heparin level this morning after rate increase to 1450 units/hour is increased but still sub-therapeutic at 0.17. Hemoglobin and platelets remain stable and wnl. Renal function remains stable. No issues with heparin infusion per RN.   Goal of Therapy:  Heparin level 0.3 - 0.5 units/ml Monitor platelets by anticoagulation protocol: Yes   Plan:  No heparin boluses Slight increase of heparin to 1650 units/hr 6 hour heparin level F/U neuro plans, length of heparin  57, PharmD, BCPS Clinical Pharmacist

## 2020-08-28 LAB — URINALYSIS, ROUTINE W REFLEX MICROSCOPIC
Bilirubin Urine: NEGATIVE
Glucose, UA: NEGATIVE mg/dL
Hgb urine dipstick: NEGATIVE
Ketones, ur: NEGATIVE mg/dL
Leukocytes,Ua: NEGATIVE
Nitrite: NEGATIVE
Protein, ur: NEGATIVE mg/dL
Specific Gravity, Urine: 1.038 — ABNORMAL HIGH (ref 1.005–1.030)
pH: 7 (ref 5.0–8.0)

## 2020-08-28 LAB — CBC
HCT: 40.7 % (ref 39.0–52.0)
Hemoglobin: 14.2 g/dL (ref 13.0–17.0)
MCH: 31.2 pg (ref 26.0–34.0)
MCHC: 34.9 g/dL (ref 30.0–36.0)
MCV: 89.5 fL (ref 80.0–100.0)
Platelets: 264 10*3/uL (ref 150–400)
RBC: 4.55 MIL/uL (ref 4.22–5.81)
RDW: 12.7 % (ref 11.5–15.5)
WBC: 8 10*3/uL (ref 4.0–10.5)
nRBC: 0 % (ref 0.0–0.2)

## 2020-08-28 LAB — BASIC METABOLIC PANEL
Anion gap: 7 (ref 5–15)
BUN: 14 mg/dL (ref 6–20)
CO2: 25 mmol/L (ref 22–32)
Calcium: 8.9 mg/dL (ref 8.9–10.3)
Chloride: 105 mmol/L (ref 98–111)
Creatinine, Ser: 0.87 mg/dL (ref 0.61–1.24)
GFR, Estimated: >60 mL/min (ref >60)
Glucose, Bld: 135 mg/dL — ABNORMAL HIGH (ref 70–99)
Potassium: 3.3 mmol/L — ABNORMAL LOW (ref 3.5–5.1)
Sodium: 137 mmol/L (ref 135–145)

## 2020-08-28 LAB — HEPARIN LEVEL (UNFRACTIONATED): Heparin Unfractionated: 0.43 IU/mL (ref 0.30–0.70)

## 2020-08-28 MED ORDER — ASPIRIN EC 81 MG PO TBEC
81.0000 mg | DELAYED_RELEASE_TABLET | Freq: Every day | ORAL | Status: DC
  Administered 2020-08-28 – 2020-08-29 (×2): 81 mg via ORAL
  Filled 2020-08-28 (×2): qty 1

## 2020-08-28 MED ORDER — CLOPIDOGREL BISULFATE 75 MG PO TABS
75.0000 mg | ORAL_TABLET | Freq: Every day | ORAL | Status: DC
  Administered 2020-08-28 – 2020-08-29 (×2): 75 mg via ORAL
  Filled 2020-08-28 (×2): qty 1

## 2020-08-28 MED ORDER — POTASSIUM CHLORIDE CRYS ER 20 MEQ PO TBCR
40.0000 meq | EXTENDED_RELEASE_TABLET | Freq: Once | ORAL | Status: AC
  Administered 2020-08-28: 40 meq via ORAL
  Filled 2020-08-28: qty 2

## 2020-08-28 NOTE — Progress Notes (Signed)
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 Suspect small area of thrombus  within a distal M2/M3 branch. Pt had L sided weakness and slurred speech/difficulty swallowing. PMHx: HTN, DM    PT Comments    Patient received at EOB finishing breakfast, very pleasant and cooperative. Mobility continues to improve- able to gait train in the hallway at a min guard level with no device. VSS on RA. No significant impairments noted in gait or balance. Left in bed with all needs met, family present. Will need to do steps before DC- will continue to follow.    Follow Up Recommendations  Outpatient PT     Equipment Recommendations  None recommended by PT    Recommendations for Other Services       Precautions / Restrictions Precautions Precautions: Fall Restrictions Weight Bearing Restrictions: No    Mobility  Bed Mobility               General bed mobility comments: sitting at EOB upon entry    Transfers Overall transfer level: Needs assistance Equipment used: None Transfers: Sit to/from Stand Sit to Stand: Supervision         General transfer comment: supervision assist, good mechanics and hand placement  Ambulation/Gait Ambulation/Gait assistance: Min guard Gait Distance (Feet): 250 Feet Assistive device: None Gait Pattern/deviations: Step-through pattern Gait velocity: WFL   General Gait Details: min guard for safety, no physical LOB or unsteadiness noted, VSS RA   Stairs             Wheelchair Mobility    Modified Rankin (Stroke Patients Only)       Balance Overall balance assessment: Mild deficits observed, not formally tested                                          Cognition Arousal/Alertness:  Awake/alert Behavior During Therapy: WFL for tasks assessed/performed Overall Cognitive Status: Within Functional Limits for tasks assessed                                        Exercises      General Comments General comments (skin integrity, edema, etc.): spouse in room and acted as interpreter      Pertinent Vitals/Pain Pain Assessment: No/denies pain    Home Living                      Prior Function            PT Goals (current goals can now be found in the care plan section) Acute Rehab PT Goals Patient Stated Goal: not stated by pt, home per wife PT Goal Formulation: With patient/family Time For Goal Achievement: 09/10/20 Potential to Achieve Goals: Good Progress towards PT goals: Progressing toward goals    Frequency    Min 4X/week      PT Plan Current plan remains appropriate    Co-evaluation              AM-PAC PT "6 Clicks" Mobility   Outcome Measure  Help needed turning from your back to your  side while in a flat bed without using bedrails?: None Help needed moving from lying on your back to sitting on the side of a flat bed without using bedrails?: None Help needed moving to and from a bed to a chair (including a wheelchair)?: None Help needed standing up from a chair using your arms (e.g., wheelchair or bedside chair)?: None Help needed to walk in hospital room?: A Little Help needed climbing 3-5 steps with a railing? : A Little 6 Click Score: 22    End of Session   Activity Tolerance: Patient tolerated treatment well Patient left: in bed;with call bell/phone within reach;with family/visitor present Nurse Communication: Mobility status PT Visit Diagnosis: Unsteadiness on feet (R26.81);Muscle weakness (generalized) (M62.81)     Time: 6372-9426 PT Time Calculation (min) (ACUTE ONLY): 12 min  Charges:  $Gait Training: 8-22 mins                    Windell Norfolk, DPT, PN1   Supplemental Physical  Therapist [place]    Pager [phone] Acute Rehab Office [phone]

## 2020-08-28 NOTE — Progress Notes (Signed)
PROGRESS NOTE    Perry Hughes  ZOX:[id number] DOB: 1965/08/24 DOA: 08/26/2020 PCP: Georgina Quint, MD    Chief Complaint  Patient presents with   Dizziness   Stroke Symptoms    Brief Narrative:   Perry Hughes is an 55 y.o. male with PMH significant for HTN was in his usual state of health until yesterday when he realized that he did not know how to move his mouth when he was eating.  Apparently his mouth would not close over his fork properly.  He also noted slurred speech as did his wife.  He went to bed hoping that it would get better but in the morning he had left lower extremity and upper extremity weakness so came to the ED   ED Course:  The patient was noted to have a facial droop, slurred speech and weakness of left arm and leg.  Code stroke was called but he was not deemed to be a candidate given last known normal was last night.  CTA of head and neck showed a 4 mm thrombus at right ICA.  Also had right MCA M3 attenuation with stroke.  Plan was for treatment with heparin per neurology.  They request [facility] hospitalist to admit the patient.  Assessment & Plan:   Active Problems:   Stroke (HCC)   HTN (hypertension)   Acute CVA -Patient with mild dysarthria, mild left side weakness, - CT cerebral perfusion revealed a 3-17mm focus of adherent thrombus or soft plaque in the medial RICA bulb but without LVO, . Patient not a thrombectomy candidate due to no LVO identified on vessel imaging. Heparin stroke protocol initiated due to adherent thrombus or soft plaque in the medial right ICA bulb. -2D echo is gWith a preserved EF 55 to 60%, grade 1 diastolic dysfunction, and negative bubble study -LDL is elevated Hemoglobin A1c is elevated at 6.9 -Patient currently on heparin GTT, discussed with neurology, recommendation for dual antiplatelet therapy time 9-month, then aspirin alone, will need to follow with neurology as an outpatient. -No arrhythmia on telemetry -PT/OT/SLP  following  HTN For permissive hypertension   Diabetes mellitus -Hemoglobin A1c is 6.9, which is diagnostic of diabetes mellitus -We will keep on insulin sliding scale. - taking metformin at home   Hyperlipidemia -LDL is 158, on Lipitor  Patient will need sleep study as an outpatient, I have counseled him about weight loss, tobacco cessation, will try to establish care with [facility], he need to follow with neurology in 4 weeks.  DVT prophylaxis: Heparin GTT Code Status: Full Family Communication: None at bedside, patient was seen via tele interpreter Disposition:   Status is: Inpatient  Remains inpatient appropriate because:IV treatments appropriate due to intensity of illness or inability to take PO  Dispo: The patient is from: Home              Anticipated d/c is to: Home              Patient currently is not medically stable to d/c.   Difficult to place patient No       Consultants:  Neurology   Subjective:  Patient denies any worsening of his focal deficits  Objective: Vitals:   08/28/20 0338 08/28/20 0739 08/28/20 0747 08/28/20 1250  BP: (!) 142/79 (!) 145/95  (!) 148/88  Pulse: 68 77  72  Resp: Temp: 98 F (36.7 C)     TempSrc: Axillary     SpO2: 98%  91% 93% 93%  Weight:      Height:        Intake/Output Summary (Last 24 hours) at 08/28/2020 1400 Last data filed at 08/27/2020 1900 Gross per 24 hour  Intake 188.72 ml  Output --  Net 188.72 ml   Filed Weights   08/26/20 0849 08/26/20 2114  Weight: 106.3 kg 108.3 kg    Examination:  Awake Alert, Oriented X 3, left side weakness is improving., Normal affect Symmetrical Chest wall movement, Good air movement bilaterally, CTAB RRR,No Gallops,Rubs or new Murmurs, No Parasternal Heave +ve B.Sounds, Abd Soft, No tenderness, No rebound - guarding or rigidity. No Cyanosis, Clubbing or edema, No new Rash or bruise     Data Reviewed: I have personally reviewed following  labs and imaging studies  CBC: Recent Labs  Lab 08/26/20 0739 08/26/20 0747 08/27/20 0009 08/28/20 0037  WBC 9.0  --  8.9 8.0  NEUTROABS 6.3  --   --   --   HGB 15.0 15.0 14.7 14.2  HCT 42.9 44.0 42.6 40.7  MCV 88.3  --  88.2 89.5  PLT 338  --  307 264    Basic Metabolic Panel: Recent Labs  Lab 08/26/20 0739 08/26/20 0747 08/28/20 0037  NA 137 140 137  K 3.5 3.5 3.3*  CL 103 102 105  CO2 26  --  25  GLUCOSE 164* 164* 135*  BUN 7 7 14   CREATININE 0.84 0.70 0.87  CALCIUM 8.9  --  8.9    GFR: Estimated Creatinine Clearance: 108.2 mL/min (by C-G formula based on SCr of 0.87 mg/dL).  Liver Function Tests: Recent Labs  Lab 08/26/20 0739  AST 19  ALT 29  ALKPHOS 82  BILITOT 0.6  PROT 6.9  ALBUMIN 3.7    CBG: Recent Labs  Lab 08/26/20 0743  GLUCAP 160*     Recent Results (from the past 240 hour(s))  Resp Panel by RT-PCR (Flu A&B, Covid) Nasopharyngeal Swab     Status: None   Collection Time: 08/26/20  9:57 AM   Specimen: Nasopharyngeal Swab; Nasopharyngeal(NP) swabs in vial transport medium  Result Value Ref Range Status   SARS Coronavirus 2 by RT PCR NEGATIVE NEGATIVE Final    Comment: (NOTE) SARS-CoV-2 target nucleic acids are NOT DETECTED.  The SARS-CoV-2 RNA is generally detectable in upper respiratory specimens during the acute phase of infection. The lowest concentration of SARS-CoV-2 viral copies this assay can detect is 138 copies/mL. A negative result does not preclude SARS-Cov-2 infection and should not be used as the sole basis for treatment or other patient management decisions. A negative result may occur with  improper specimen collection/handling, submission of specimen other than nasopharyngeal swab, presence of viral mutation(s) within the areas targeted by this assay, and inadequate number of viral copies(<138 copies/mL). A negative result must be combined with clinical observations, patient history, and  epidemiological information. The expected result is Negative.  Fact Sheet for Patients:  10/27/20  Fact Sheet for Healthcare Providers:  BloggerCourse.com  This test is no t yet approved or cleared by the [place] FDA and  has been authorized for detection and/or diagnosis of SARS-CoV-2 by FDA under an Emergency Use Authorization (EUA). This EUA will remain  in effect (meaning this test can be used) for the duration of the COVID-19 declaration under Section 564(b)(1) of the Act, 21 U.S.C.section 360bbb-3(b)(1), unless the authorization is terminated  or revoked sooner.       Influenza A by PCR NEGATIVE NEGATIVE  Final   Influenza B by PCR NEGATIVE NEGATIVE Final    Comment: (NOTE) The Xpert Xpress SARS-CoV-2/FLU/RSV plus assay is intended as an aid in the diagnosis of influenza from Nasopharyngeal swab specimens and should not be used as a sole basis for treatment. Nasal washings and aspirates are unacceptable for Xpert Xpress SARS-CoV-2/FLU/RSV testing.  Fact Sheet for Patients: BloggerCourse.com  Fact Sheet for Healthcare Providers: SeriousBroker.it  This test is not yet approved or cleared by the [place] FDA and has been authorized for detection and/or diagnosis of SARS-CoV-2 by FDA under an Emergency Use Authorization (EUA). This EUA will remain in effect (meaning this test can be used) for the duration of the COVID-19 declaration under Section 564(b)(1) of the Act, 21 U.S.C. section 360bbb-3(b)(1), unless the authorization is terminated or revoked.  Performed at [facility], [address] [place], [place] [address]          Radiology Studies: DG CHEST PORT 1 VIEW  Result Date: 08/27/2020 CLINICAL DATA:  Hypoxia EXAM: PORTABLE CHEST 1 VIEW COMPARISON:  None. FINDINGS: The heart size and mediastinal contours are within normal limits. Both  lungs are clear. The visualized skeletal structures are unremarkable. IMPRESSION: No active disease. Electronically Signed   By: Deatra Robinson M.D.   On: 08/27/2020 00:32   ECHOCARDIOGRAM COMPLETE BUBBLE STUDY  Result Date: 08/27/2020    ECHOCARDIOGRAM REPORT   Patient Name:   Perry Hughes Date of Exam: 08/27/2020 Medical Rec #:  [id number]       Height:       64.0 in Accession #:    [id number]      Weight:       238.8 lb Date of Birth:  Mar 04, 1965      BSA:          2.109 m Patient Age:    54 years        BP:           163/85 mmHg Patient Gender: M               HR:           67 bpm. Exam Location:  Inpatient Procedure: 2D Echo and Saline Contrast Bubble Study Indications:    stroke  History:        Patient has no prior history of Echocardiogram examinations.                 Risk Factors:Sleep Apnea.  Sonographer:    Delcie Roch Referring Phys: [id number] SROBONA TUBLU CHATTERJEE IMPRESSIONS  1. Left ventricular ejection fraction, by estimation, is 55 to 60%. The left ventricle has normal function. The left ventricle has no regional wall motion abnormalities. Left ventricular diastolic parameters are consistent with Grade I diastolic dysfunction (impaired relaxation).  2. Right ventricular systolic function is normal. The right ventricular size is normal.  3. The mitral valve is grossly normal. No evidence of mitral valve regurgitation.  4. The aortic valve is tricuspid. Aortic valve regurgitation is not visualized.  5. The inferior vena cava is normal in size with greater than 50% respiratory variability, suggesting right atrial pressure of 3 mmHg.  6. Agitated saline contrast bubble study was negative, with no evidence of any interatrial shunt. Comparison(s): No prior Echocardiogram. FINDINGS  Left Ventricle: Left ventricular ejection fraction, by estimation, is 55 to 60%. The left ventricle has normal function. The left ventricle has no regional wall motion abnormalities. The left ventricular internal  cavity size was normal in  size. There is  no left ventricular hypertrophy. Left ventricular diastolic parameters are consistent with Grade I diastolic dysfunction (impaired relaxation). Indeterminate filling pressures. Right Ventricle: The right ventricular size is normal. No increase in right ventricular wall thickness. Right ventricular systolic function is normal. Left Atrium: Left atrial size was normal in size. Right Atrium: Right atrial size was normal in size. Pericardium: There is no evidence of pericardial effusion. Mitral Valve: The mitral valve is grossly normal. No evidence of mitral valve regurgitation. Tricuspid Valve: The tricuspid valve is grossly normal. Tricuspid valve regurgitation is trivial. Aortic Valve: The aortic valve is tricuspid. Aortic valve regurgitation is not visualized. Pulmonic Valve: The pulmonic valve was normal in structure. Pulmonic valve regurgitation is not visualized. Aorta: The aortic root and ascending aorta are structurally normal, with no evidence of dilitation. Venous: The inferior vena cava is normal in size with greater than 50% respiratory variability, suggesting right atrial pressure of 3 mmHg. IAS/Shunts: No atrial level shunt detected by color flow Doppler. Agitated saline contrast was given intravenously to evaluate for intracardiac shunting. Agitated saline contrast bubble study was negative, with no evidence of any interatrial shunt.  LEFT VENTRICLE PLAX 2D LVIDd:         5.80 cm  Diastology LVIDs:         4.40 cm  LV e' medial:    5.98 cm/s LV PW:         1.10 cm  LV E/e' medial:  9.9 LV IVS:        0.90 cm  LV e' lateral:   9.57 cm/s LVOT diam:     1.90 cm  LV E/e' lateral: 6.2 LV SV:         67 LV SV Index:   32 LVOT Area:     2.84 cm  RIGHT VENTRICLE         IVC TAPSE (M-mode): 1.5 cm  IVC diam: 1.40 cm LEFT ATRIUM             Index       RIGHT ATRIUM           Index LA diam:        3.40 cm 1.61 cm/m  RA Area:     13.00 cm LA Vol (A2C):   35.9 ml 17.02  ml/m RA Volume:   28.20 ml  13.37 ml/m LA Vol (A4C):   43.4 ml 20.58 ml/m LA Biplane Vol: 41.2 ml 19.53 ml/m  AORTIC VALVE LVOT Vmax:   138.00 cm/s LVOT Vmean:  85.700 cm/s LVOT VTI:    0.237 m  AORTA Ao Root diam: 3.30 cm Ao Asc diam:  3.30 cm MITRAL VALVE MV Area (PHT): 3.30 cm    SHUNTS MV Decel Time: 230 msec    Systemic VTI:  0.24 m MV E velocity: 59.40 cm/s  Systemic Diam: 1.90 cm MV A velocity: 81.40 cm/s MV E/A ratio:  0.73 Zoila Shutter MD Electronically signed by Zoila Shutter MD Signature Date/Time: 08/27/2020/3:21:35 PM    Final         Scheduled Meds:   stroke: mapping our early stages of recovery book   Does not apply Once   aspirin EC  81 mg Oral Daily   atorvastatin  80 mg Oral QPM   clopidogrel  75 mg Oral Daily   Continuous Infusions:     LOS: 2 days      Huey Bienenstock, MD [facility]   To contact the attending provider between 7A-7P or  the covering provider during after hours 7P-7A, please log into the web site [email or web address] and access using universal [place] password for that web site. If you do not have the password, please call the hospital operator.  08/28/2020, 2:00 PM

## 2020-08-28 NOTE — Progress Notes (Addendum)
STROKE TEAM PROGRESS NOTE   INTERVAL HISTORY His family who speak English are at bedside. However, interpretor provided as the pt is Spanish speaking only. No c/o of h/a or pain today and his wife says he is 99% improved today. We d/w pt and family the plan to stop heparin gtt and start DAPT for the R ICA thrombus.  Vitals:   08/28/20 0100 08/28/20 0338 08/28/20 0739 08/28/20 0747  BP:  (!) 142/79 (!) 145/95   Pulse: 64 68 77   Resp:  18 20   Temp:  98 F (36.7 C)    TempSrc:  Axillary    SpO2: 98% 98% 91% 93%  Weight:      Height:       CBC:  Recent Labs  Lab 08/26/20 0739 08/26/20 0747 08/27/20 0009 08/28/20 0037  WBC 9.0  --  8.9 8.0  NEUTROABS 6.3  --   --   --   HGB 15.0   < > 14.7 14.2  HCT 42.9   < > 42.6 40.7  MCV 88.3  --  88.2 89.5  PLT 338  --  307 264   < > = values in this interval not displayed.    Basic Metabolic Panel:  Recent Labs  Lab 08/26/20 0739 08/26/20 0747 08/28/20 0037  NA 137 140 137  K 3.5 3.5 3.3*  CL 103 102 105  CO2 26  --  25  GLUCOSE 164* 164* 135*  BUN 7 7 14   CREATININE 0.84 0.70 0.87  CALCIUM 8.9  --  8.9    Lipid Panel:  Recent Labs  Lab 08/27/20 0009  CHOL 201*  TRIG 136  HDL 29*  CHOLHDL 6.9  VLDL 27  LDLCALC 161145*    HgbA1c:  Recent Labs  Lab 08/27/20 0009  HGBA1C 6.9*    Urine Drug Screen:  Recent Labs  Lab 08/26/20 0957  LABOPIA NONE DETECTED  COCAINSCRNUR NONE DETECTED  LABBENZ NONE DETECTED  AMPHETMU NONE DETECTED  THCU NONE DETECTED  LABBARB NONE DETECTED     Alcohol Level  Recent Labs  Lab 08/26/20 0801  ETH <10     IMAGING past 24 hours ECHOCARDIOGRAM COMPLETE BUBBLE STUDY  Result Date: 08/27/2020    ECHOCARDIOGRAM REPORT   Patient Name:   Tamsen MeekSALVADOR Schabel Date of Exam: 08/27/2020 Medical Rec #:  [id number]       Height:       64.0 in Accession #:    [id number]      Weight:       238.8 lb Date of Birth:  04/28/1965      BSA:          2.109 m Patient Age:    55 years        BP:            163/85 mmHg Patient Gender: M               HR:           67 bpm. Exam Location:  Inpatient Procedure: 2D Echo and Saline Contrast Bubble Study Indications:    stroke  History:        Patient has no prior history of Echocardiogram examinations.                 Risk Factors:Sleep Apnea.  Sonographer:    Delcie RochLauren Pennington Referring Phys: [id number] SROBONA TUBLU CHATTERJEE IMPRESSIONS  1. Left ventricular ejection fraction, by estimation, is 55 to 60%. The left ventricle  has normal function. The left ventricle has no regional wall motion abnormalities. Left ventricular diastolic parameters are consistent with Grade I diastolic dysfunction (impaired relaxation).  2. Right ventricular systolic function is normal. The right ventricular size is normal.  3. The mitral valve is grossly normal. No evidence of mitral valve regurgitation.  4. The aortic valve is tricuspid. Aortic valve regurgitation is not visualized.  5. The inferior vena cava is normal in size with greater than 50% respiratory variability, suggesting right atrial pressure of 3 mmHg.  6. Agitated saline contrast bubble study was negative, with no evidence of any interatrial shunt. Comparison(s): No prior Echocardiogram. FINDINGS  Left Ventricle: Left ventricular ejection fraction, by estimation, is 55 to 60%. The left ventricle has normal function. The left ventricle has no regional wall motion abnormalities. The left ventricular internal cavity size was normal in size. There is  no left ventricular hypertrophy. Left ventricular diastolic parameters are consistent with Grade I diastolic dysfunction (impaired relaxation). Indeterminate filling pressures. Right Ventricle: The right ventricular size is normal. No increase in right ventricular wall thickness. Right ventricular systolic function is normal. Left Atrium: Left atrial size was normal in size. Right Atrium: Right atrial size was normal in size. Pericardium: There is no evidence of pericardial effusion.  Mitral Valve: The mitral valve is grossly normal. No evidence of mitral valve regurgitation. Tricuspid Valve: The tricuspid valve is grossly normal. Tricuspid valve regurgitation is trivial. Aortic Valve: The aortic valve is tricuspid. Aortic valve regurgitation is not visualized. Pulmonic Valve: The pulmonic valve was normal in structure. Pulmonic valve regurgitation is not visualized. Aorta: The aortic root and ascending aorta are structurally normal, with no evidence of dilitation. Venous: The inferior vena cava is normal in size with greater than 50% respiratory variability, suggesting right atrial pressure of 3 mmHg. IAS/Shunts: No atrial level shunt detected by color flow Doppler. Agitated saline contrast was given intravenously to evaluate for intracardiac shunting. Agitated saline contrast bubble study was negative, with no evidence of any interatrial shunt.  LEFT VENTRICLE PLAX 2D LVIDd:         5.80 cm  Diastology LVIDs:         4.40 cm  LV e' medial:    5.98 cm/s LV PW:         1.10 cm  LV E/e' medial:  9.9 LV IVS:        0.90 cm  LV e' lateral:   9.57 cm/s LVOT diam:     1.90 cm  LV E/e' lateral: 6.2 LV SV:         67 LV SV Index:   32 LVOT Area:     2.84 cm  RIGHT VENTRICLE         IVC TAPSE (M-mode): 1.5 cm  IVC diam: 1.40 cm LEFT ATRIUM             Index       RIGHT ATRIUM           Index LA diam:        3.40 cm 1.61 cm/m  RA Area:     13.00 cm LA Vol (A2C):   35.9 ml 17.02 ml/m RA Volume:   28.20 ml  13.37 ml/m LA Vol (A4C):   43.4 ml 20.58 ml/m LA Biplane Vol: 41.2 ml 19.53 ml/m  AORTIC VALVE LVOT Vmax:   138.00 cm/s LVOT Vmean:  85.700 cm/s LVOT VTI:    0.237 m  AORTA Ao Root diam: 3.30 cm Ao Asc  diam:  3.30 cm MITRAL VALVE MV Area (PHT): 3.30 cm    SHUNTS MV Decel Time: 230 msec    Systemic VTI:  0.24 m MV E velocity: 59.40 cm/s  Systemic Diam: 1.90 cm MV A velocity: 81.40 cm/s MV E/A ratio:  0.73 Zoila Shutter MD Electronically signed by Zoila Shutter MD Signature Date/Time:  08/27/2020/3:21:35 PM    Final     PHYSICAL EXAM General: Appears well-developed, no distress. Psych: Affect appropriate to situation Eyes: No scleral injection HENT: No OP obstrucion Head: Normocephalic.  Cardiovascular: Normal rate and regular rhythm.  Respiratory: Effort normal and breath sounds normal to anterior ascultation GI: Soft.  No distension. There is no tenderness.  Skin: WDI    Neurological Examination Mental Status: Alert, oriented, thought content appropriate.  Speech fluent without evidence of aphasia. No further dysarthria noted. Able to follow 3 step commands without difficulty. Cranial Nerves: II: Visual fields grossly normal,  III,IV, VI: ptosis not present, extra-ocular motions intact bilaterally, pupils equal, round, reactive to light and accommodation V,VII: left facial droop noted, facial light touch sensation normal bilaterally VIII: hearing normal bilaterally IX,X: uvula rises symmetrically XI: bilateral shoulder shrug XII: midline tongue extension Motor: Right : Upper extremity   5/5    Left:     Upper extremity   5/5  Lower extremity   5/5     Lower extremity   5/5 Tone and bulk:normal tone throughout; no atrophy noted. There is some slight fine motor incoordination and a bit slower on left  Sensory: Pinprick and light touch intact throughout, bilaterally Deep Tendon Reflexes: 2+ and symmetric throughout Plantars: Right: downgoing   Left: downgoing Cerebellar: normal finger-to-nose, normal rapid alternating movements and normal heel-to-shin test Gait: normal gait and station   ASSESSMENT/PLAN Mr. Perry Hughes is a 55 y.o. male with history of tobacco use, obesity- BMI 40.23, and essential HTN who presented for evaluation of dysarthria and left-sided weakness that started the night PTA.  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  Code Stroke CT head No acute abnormality. ASPECTS 10.    CTA head & neck: 3-4 mm focus of adherent  thrombus or soft plaque in the medial Right ICA bulb. No large vessel occlusion, but attenuated appearance of the Right MCA M3 branches corresponds to CT Perfusion finding of 9 mL estimated penumbra in the posterior Right MCA territory. No core infarct detected by CTP. No atherosclerosis elsewhere in the head and neck. Tortuous proximal great vessels. MRI  Several small acute infarcts of the right MCA territory involving frontoparietal lobes and insula. Suspect small area of thrombus within a distal M2/M3 branch. 2D Echo pending LDL 145 HgbA1c 6.9 VTE prophylaxis - On heparin gtt currently and once d/c'd he is freely ambulating and ppx not needed    Diet   Diet heart healthy/carb modified Room service appropriate? Yes; Fluid consistency: Thin   none  prior to admission, now on heparin IV. Plan to transition to ASA 81mg  + plavix 75mg  x 3 months.  Therapy recommendations:  home w/no rehab needs Disposition:  ok to d/c home from neuro standpoint.  Hypertension Home meds:  Zestril Stable Permissive hypertension (OK if < 220/120) but gradually normalize in 5-7 days Long-term BP goal normotensive  Hyperlipidemia Home meds:  none,  LDL 145, goal < 70 Add Lipitor 80mg  po daily  High intensity statin  Continue statin at discharge  Diabetes type II Controlled Home meds:  glucaphage HgbA1c 6.9, goal < 7.0 CBGs Recent  Labs    08/26/20 0743  GLUCAP 160*     SSI  Other Stroke Risk Factors Cigarette smoke; advised to stop smoking Obesity, Body mass index is 40.98 kg/m., BMI >/= 30 associated with increased stroke risk, recommend weight loss, diet and exercise as appropriate  Hx stroke/TIA Family hx stroke  Obstructive sleep apnea; has been using Cpap here, but has not been formally diagnosed- recommend out pt sleep study if he can afford insurance coverage.  Inactivity and high fat diet- reviewed heart healthy exercise and diet with pt  Hospital day # 2 Since pt is uninsured, we  will d/c on DAPT x 32mo and have close f/u in 4 weeks with Dr Pearlean Brownie at [facility] (this is placed in depart). Stroke education and plan reviewed with pt and family at bedside. They are agree able to the plan  Desiree Metzger-Cihelka, ARNP-C, ANVP-BC  I have personally obtained history,examined this patient, reviewed notes, independently viewed imaging studies, participated in medical decision making and plan of care.ROS completed by me personally and pertinent positives fully documented  I have made any additions or clarifications directly to the above note. Agree with note above.  Patient has a patchy right MCA infarct likely symptomatic from proximal right ICA thrombus.  He does not have health insurance and will not be able to afford Eliquis or the frequent blood checks for monitoring warfarin hence recommend aspirin 81 and Plavix 75 mg daily for 3 months followed by aspirin alone and aggressive risk factor modification.  Long discussion with patient and his family at the bedside using Spanish language interpreter via video and answered questions.  Discussed with Dr. Mliss Fritz.  Greater than 50% time during this 25-minute visit was spent on counseling and coordination of care about his stroke and carotid clot and answering questions.  Follow-up with an outpatient stroke clinic in 6 weeks.  Stroke team will sign off and call for questions  Delia Heady, MD Medical Director [facility] Pager: [phone] 08/28/2020 2:56 PM  Pager: [phone]   To contact Stroke Continuity provider, please refer to [email or web address]. After hours, contact General Neurology

## 2020-08-28 NOTE — TOC Initial Note (Signed)
Transition of Care [facility]) - Initial/Assessment Note    Patient Details  Name: Perry Hughes MRN: [id number] Date of Birth: 1965-09-24  Transition of Care [facility]) CM/SW Contact:    Pollie Friar, RN Phone Number: 08/28/2020, 11:02 AM  Clinical Narrative:                 CM met with the patient and family in the room that speaks [place]. Family member interpreted. Pt is without a PCP or insurance. He is interested in getting set up with one of the [facility]. He could then also use [facility] pharmacy for his medications. Today is holiday so appt will need to be made tomorrow. CM will send this information to [facility] for appt.  Pt interested in outpatient therapy at [facility]. Orders in Epic and will need appt scheduled prior to d/c. CM will call and schedule in am.  Pt has needed supervision at home and no issues with transportation. TOC following.  Expected Discharge Plan: OP Rehab Barriers to Discharge: Inadequate or no insurance   Patient Goals and CMS Choice        Expected Discharge Plan and Services Expected Discharge Plan: OP Rehab   Discharge Planning Services: CM Consult   Living arrangements for the past 2 months: Single Family Home                                      Prior Living Arrangements/Services Living arrangements for the past 2 months: Single Family Home Lives with:: Spouse Patient language and need for interpreter reviewed:: Yes (Spanish) Do you feel safe going back to the place where you live?: Yes      Need for Family Participation in Patient Care: Yes (Comment) Care giver support system in place?: Yes (comment)   Criminal Activity/Legal Involvement Pertinent to Current Situation/Hospitalization: No - Comment as needed  Activities of Daily Living Home Assistive Devices/Equipment: None ADL Screening (condition at time of admission) Patient's cognitive ability adequate to safely complete daily activities?: Yes Is the patient deaf or  have difficulty hearing?: No Does the patient have difficulty seeing, even when wearing glasses/contacts?: No Does the patient have difficulty concentrating, remembering, or making decisions?: No Patient able to express need for assistance with ADLs?: Yes Does the patient have difficulty dressing or bathing?: No Independently performs ADLs?: Yes (appropriate for developmental age) Does the patient have difficulty walking or climbing stairs?: Yes Weakness of Legs: Both Weakness of Arms/Hands: Both  Permission Sought/Granted                  Emotional Assessment Appearance:: Appears stated age Attitude/Demeanor/Rapport: Engaged Affect (typically observed): Accepting Orientation: : Oriented to Self, Oriented to Place, Oriented to  Time, Oriented to Situation   Psych Involvement: No (comment)  Admission diagnosis:  Stroke [facility]) [I63.9] Cerebrovascular accident (CVA), unspecified mechanism [facility]) [I63.9] Patient Active Problem List   Diagnosis Date Noted   Stroke [facility]) 08/26/2020   HTN (hypertension) 08/26/2020   PCP:  Horald Pollen, MD Pharmacy:   [facility] [place] [address] [place] [address] Phone: [phone] Fax: [phone]  [facility] [address] [place] [address] Phone: [phone] Fax: [phone]     Social Determinants of Health (SDOH) Interventions    Readmission Risk Interventions No flowsheet data found.

## 2020-08-28 NOTE — Progress Notes (Signed)
ANTICOAGULATION CONSULT NOTE   Pharmacy Consult for heparin Indication: stroke  No Known Allergies  Patient Measurements: Height: 5\' 4"  (162.6 cm) Weight: 108.3 kg (238 lb 12.1 oz) IBW/kg (Calculated) : 59.2 Heparin Dosing Weight: 84 kg  Vital Signs: Temp: 98 F (36.7 C) (07/04 0338) Temp Source: Axillary (07/04 0338) BP: 145/95 (07/04 0739) Pulse Rate: 77 (07/04 0739)  Labs: Recent Labs    08/26/20 0739 08/26/20 0747 08/26/20 1748 08/27/20 0009 08/27/20 0828 08/27/20 1627 08/28/20 0037  HGB 15.0 15.0  --  14.7  --   --  14.2  HCT 42.9 44.0  --  42.6  --   --  40.7  PLT 338  --   --  307  --   --  264  APTT 32  --   --   --   --   --   --   LABPROT 13.5  --   --   --   --   --   --   INR 1.0  --   --   --   --   --   --   HEPARINUNFRC  --   --    < > <0.10* 0.17* 0.31 0.43  CREATININE 0.84 0.70  --   --   --   --  0.87   < > = values in this interval not displayed.     Estimated Creatinine Clearance: 108.2 mL/min (by C-G formula based on SCr of 0.87 mg/dL).  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 PMH significant for HTN. No anticoagulation PTA.    Goal of Therapy:  Heparin level 0.3 - 0.5 units/ml Monitor platelets by anticoagulation protocol: Yes   Plan:  Continue heparin infusion at 1650 units/hr Follow up AM labs  Thank you 57, PharmD 08/28/2020 8:02 AM  Please check AMION for all [facility] Pharmacy phone numbers After 10:00 PM, call Main Pharmacy [phone]

## 2020-08-29 ENCOUNTER — Other Ambulatory Visit (HOSPITAL_COMMUNITY): Payer: Self-pay

## 2020-08-29 LAB — CBC
HCT: 40.1 % (ref 39.0–52.0)
Hemoglobin: 14 g/dL (ref 13.0–17.0)
MCH: 31.5 pg (ref 26.0–34.0)
MCHC: 34.9 g/dL (ref 30.0–36.0)
MCV: 90.3 fL (ref 80.0–100.0)
Platelets: 331 10*3/uL (ref 150–400)
RBC: 4.44 MIL/uL (ref 4.22–5.81)
RDW: 13.1 % (ref 11.5–15.5)
WBC: 6 10*3/uL (ref 4.0–10.5)
nRBC: 0 % (ref 0.0–0.2)

## 2020-08-29 LAB — GLUCOSE, CAPILLARY: Glucose-Capillary: 130 mg/dL — ABNORMAL HIGH (ref 70–99)

## 2020-08-29 MED ORDER — METFORMIN HCL 500 MG PO TABS
500.0000 mg | ORAL_TABLET | Freq: Two times a day (BID) | ORAL | 2 refills | Status: DC
  Filled 2020-08-29: qty 60, 30d supply, fill #0

## 2020-08-29 MED ORDER — LISINOPRIL 20 MG PO TABS
20.0000 mg | ORAL_TABLET | Freq: Every morning | ORAL | 2 refills | Status: DC
  Filled 2020-08-29: qty 30, 30d supply, fill #0

## 2020-08-29 MED ORDER — ASPIRIN 81 MG PO TBEC
81.0000 mg | DELAYED_RELEASE_TABLET | Freq: Every day | ORAL | 2 refills | Status: AC
  Filled 2020-08-29: qty 30, 30d supply, fill #0

## 2020-08-29 MED ORDER — ATORVASTATIN CALCIUM 80 MG PO TABS
80.0000 mg | ORAL_TABLET | Freq: Every evening | ORAL | 2 refills | Status: DC
  Filled 2020-08-29: qty 30, 30d supply, fill #0

## 2020-08-29 MED ORDER — CLOPIDOGREL BISULFATE 75 MG PO TABS
75.0000 mg | ORAL_TABLET | Freq: Every day | ORAL | 2 refills | Status: DC
  Filled 2020-08-29: qty 30, 30d supply, fill #0

## 2020-08-29 NOTE — Plan of Care (Signed)
  Problem: Education: Goal: Knowledge of disease or condition will improve Outcome: Adequate for Discharge Goal: Knowledge of secondary prevention will improve Outcome: Adequate for Discharge Goal: Knowledge of patient specific risk factors addressed and post discharge goals established will improve Outcome: Adequate for Discharge Goal: Individualized Educational Video(s) Outcome: Adequate for Discharge   Problem: Health Behavior/Discharge Planning: Goal: Ability to manage health-related needs will improve Outcome: Adequate for Discharge   Problem: Self-Care: Goal: Ability to participate in self-care as condition permits will improve Outcome: Adequate for Discharge Goal: Ability to communicate needs accurately will improve Outcome: Adequate for Discharge   Problem: Nutrition: Goal: Risk of aspiration will decrease Outcome: Adequate for Discharge   Problem: Ischemic Stroke/TIA Tissue Perfusion: Goal: Complications of ischemic stroke/TIA will be minimized Outcome: Adequate for Discharge   Problem: Education: Goal: Knowledge of General Education information will improve Description: Including pain rating scale, medication(s)/side effects and non-pharmacologic comfort measures Outcome: Adequate for Discharge   Problem: Health Behavior/Discharge Planning: Goal: Ability to manage health-related needs will improve Outcome: Adequate for Discharge   Problem: Clinical Measurements: Goal: Ability to maintain clinical measurements within normal limits will improve Outcome: Adequate for Discharge Goal: Will remain free from infection Outcome: Adequate for Discharge Goal: Diagnostic test results will improve Outcome: Adequate for Discharge Goal: Respiratory complications will improve Outcome: Adequate for Discharge Goal: Cardiovascular complication will be avoided Outcome: Adequate for Discharge   Problem: Activity: Goal: Risk for activity intolerance will decrease Outcome:  Adequate for Discharge   Problem: Nutrition: Goal: Adequate nutrition will be maintained Outcome: Adequate for Discharge   Problem: Coping: Goal: Level of anxiety will decrease Outcome: Adequate for Discharge   Problem: Elimination: Goal: Will not experience complications related to bowel motility Outcome: Adequate for Discharge Goal: Will not experience complications related to urinary retention Outcome: Adequate for Discharge   Problem: Pain Managment: Goal: General experience of comfort will improve Outcome: Adequate for Discharge   Problem: Safety: Goal: Ability to remain free from injury will improve Outcome: Adequate for Discharge   Problem: Skin Integrity: Goal: Risk for impaired skin integrity will decrease Outcome: Adequate for Discharge

## 2020-08-29 NOTE — Discharge Summary (Signed)
Physician Discharge Summary  Perry Hughes YNW:[id number] DOB: 1966/01/12 DOA: 08/26/2020  PCP: Georgina Quint, MD  Admit date: 08/26/2020 Discharge date: 08/29/2020  Admitted From: Home Disposition:  Home   Recommendations for Outpatient Follow-up:  Follow up with PCP in 1-2 weeks Please obtain BMP/CBC in one week Continue counseling about tobacco cessation and weight loss, Patient will need sleep study as an outpatient.  Home Health:outpatient PT/OT  Discharge Condition:Stable CODE STATUS:FULL Diet recommendation: Heart Healthy / Carb Modified   Brief/Interim Summary:   Acute CVA -Patient with mild dysarthria, mild left side weakness, is almost resolved by time of discharge - CT cerebral perfusion revealed a 3-57mm focus of adherent thrombus or soft plaque in the medial RICA bulb but without LVO, . Patient not a thrombectomy candidate due to no LVO identified on vessel imaging. Heparin stroke protocol initiated due to adherent thrombus or soft plaque in the medial right ICA bulb. -2D echo is gWith a preserved EF 55 to 60%, grade 1 diastolic dysfunction, and negative bubble study -LDL is elevated, he was started on statin. Hemoglobin A1c is elevated at 6.9 -Patient currently on heparin GTT, discussed with neurology, recommendation for dual antiplatelet therapy time 14-month, then aspirin alone, will need to follow with neurology as an outpatient. -No arrhythmia on telemetry -PT/OT/SLP following   HTN Resume lisinopril on discharge   Diabetes mellitus -Hemoglobin A1c is 6.9,  - taking metformin at home, will be continued on discharge     Hyperlipidemia -LDL is 158, on Lipitor    Patient will need sleep study as an outpatient,   I have counseled him about weight loss, tobacco cessation    Discharge Diagnoses:  Active Problems:   Stroke (HCC)   HTN (hypertension)    Discharge Instructions  Discharge Instructions     Ambulatory referral to Neurology    Complete by: As directed    An appointment is requested in approximately: 4 weeks   Diet - low sodium heart healthy   Complete by: As directed    Discharge instructions   Complete by: As directed    Follow with Primary MD Georgina Quint, MD in 7 days   Get CBC, CMP,  checked  by Primary MD next visit.    Activity: As tolerated with Full fall precautions use walker/cane & assistance as needed   Disposition Home    Diet: Heart Healthy / carb modified   On your next visit with your primary care physician please Get Medicines reviewed and adjusted.   Please request your Prim.MD to go over all Hospital Tests and Procedure/Radiological results at the follow up, please get all Hospital records sent to your Prim MD by signing hospital release before you go home.   If you experience worsening of your admission symptoms, develop shortness of breath, life threatening emergency, suicidal or homicidal thoughts you must seek medical attention immediately by calling 911 or calling your MD immediately  if symptoms less severe.  You Must read complete instructions/literature along with all the possible adverse reactions/side effects for all the Medicines you take and that have been prescribed to you. Take any new Medicines after you have completely understood and accpet all the possible adverse reactions/side effects.   Do not drive when taking Pain medications.    Do not take more than prescribed Pain, Sleep and Anxiety Medications  Special Instructions: If you have smoked or chewed Tobacco  in the last 2 yrs please stop smoking, stop any regular Alcohol  and or  any Recreational drug use.  Wear Seat belts while driving.   Please note  You were cared for by a hospitalist during your hospital stay. If you have any questions about your discharge medications or the care you received while you were in the hospital after you are discharged, you can call the unit and asked to speak with  the hospitalist on call if the hospitalist that took care of you is not available. Once you are discharged, your primary care physician will handle any further medical issues. Please note that NO REFILLS for any discharge medications will be authorized once you are discharged, as it is imperative that you return to your primary care physician (or establish a relationship with a primary care physician if you do not have one) for your aftercare needs so that they can reassess your need for medications and monitor your lab values.   Increase activity slowly   Complete by: As directed       Allergies as of 08/29/2020   No Known Allergies      Medication List     TAKE these medications    Aspirin Low Dose 81 MG EC tablet Generic drug: aspirin Take 1 tablet (81 mg total) by mouth daily. Swallow whole. Start taking on: August 30, 2020   atorvastatin 80 MG tablet Commonly known as: LIPITOR Take 1 tablet (80 mg total) by mouth every evening.   clopidogrel 75 MG tablet Commonly known as: PLAVIX Tome 1 tableta (75 mg en total) por va oral diariamente. (Take 1 tablet (75 mg total) by mouth daily.) Start taking on: August 30, 2020   lisinopril 20 MG tablet Commonly known as: ZESTRIL Take 1 tablet (20 mg total) by mouth every morning.   metFORMIN 500 MG tablet Commonly known as: GLUCOPHAGE Take 1 tablet (500 mg total) by mouth 2 (two) times daily with a meal.        Follow-up Information     [facility]. Schedule an appointment as soon as possible for a visit in 1 week(s).   Specialty: Rehabilitation Contact information: [address] [id number] mc [place] [address] [phone]        [facility]. Schedule an appointment as soon as possible for a visit in 4 week(s).   Specialty: Neurology Contact information: [address] [place] [address] [phone]         [facility] Follow up on 09/26/2020.   Why: at 1:30pm for hospital follow-up Contact information: [address] [place] [address] [phone]               No Known Allergies  Consultations: Neurology   Procedures/Studies: MR BRAIN WO CONTRAST  Result Date: 08/26/2020 CLINICAL DATA:  Difficulty speaking and swallowing EXAM: MRI HEAD WITHOUT CONTRAST TECHNIQUE: Multiplanar, multiecho pulse sequences of the brain and surrounding structures were obtained without intravenous contrast. COMPARISON:  Correlation made with recent CT imaging FINDINGS: Brain: Several small foci of reduced diffusion are present with involvement of the right frontoparietal lobes and right insula. No evidence of intracranial hemorrhage. Ventricles and sulci are normal in size and configuration. Patchy foci of T2 hyperintensity in the supratentorial white matter are nonspecific but may reflect mild chronic microvascular ischemic changes. There is no intracranial mass or mass effect. No hydrocephalus or extra-axial collection. Vascular: Curvilinear susceptibility likely along a distal M2/M3 MCA branch probably reflecting thrombus. Major vessel flow voids at the skull base  are preserved. Skull and upper cervical spine: Normal marrow signal is preserved. Sinuses/Orbits: Paranasal sinuses are aerated. Orbits are unremarkable. Other: Sella is unremarkable.  Mastoid air cells are clear. IMPRESSION: Several small acute infarcts of the right MCA territory involving frontoparietal lobes and insula. Suspect small area of thrombus within a distal M2/M3 branch. Mild chronic microvascular ischemic changes. Electronically Signed   By: Guadlupe Spanish M.D.   On: 08/26/2020 11:22   CT CEREBRAL PERFUSION W CONTRAST  Result Date: 08/26/2020 CLINICAL DATA:  55 year old male code stroke presentation with left side symptoms. EXAM: CT ANGIOGRAPHY HEAD AND NECK CT PERFUSION BRAIN  TECHNIQUE: Multidetector CT imaging of the head and neck was performed using the standard protocol during bolus administration of intravenous contrast. Multiplanar CT image reconstructions and MIPs were obtained to evaluate the vascular anatomy. Carotid stenosis measurements (when applicable) are obtained utilizing NASCET criteria, using the distal internal carotid diameter as the denominator. Multiphase CT imaging of the brain was performed following IV bolus contrast injection. Subsequent parametric perfusion maps were calculated using RAPID software. CONTRAST:  OMNIPAQUE IOHEXOL 350 MG/ML SOLN COMPARISON:  Plain head CT 0812 hours today. FINDINGS: CT Brain Perfusion Findings: ASPECTS: 10 CBF (<30%) Volume: 0mL Perfusion (Tmax>6.0s) volume: 9 mL.  Hypoperfusion index 0.0. Mismatch Volume: 9 mLmL Infarction Location:Ischemia in the posterior right MCA territory. CTA NECK Skeleton: Intermittent carious dentition. No acute osseous abnormality identified. Upper chest: Negative aside from minor atelectasis. Other neck: Negative. Aortic arch: 3 vessel arch configuration. No arch atherosclerosis. Mildly tortuous great vessels. Right carotid system: Mild brachiocephalic and proximal right CCA tortuosity. Negative right CCA. Patent right carotid bifurcation with rounded roughly 3-4 mm area of soft plaque or adherent thrombus at the medial right ICA bulb on series 3, image 116. No other atherosclerosis. And patent right ICA to the skull base without stenosis. Left carotid system: Negative aside from tortuosity. Vertebral arteries: Negative aside from tortuous proximal subclavian arteries, left vertebral artery V1 segment. Codominant vertebrals. CTA HEAD Posterior circulation: Codominant distal vertebral arteries with tortuosity to the vertebrobasilar junction, but no plaque or stenosis. Both PICA origins are patent. Patent basilar artery with mild tortuosity. No stenosis. Patent SCA and PCA origins. Posterior  communicating arteries are diminutive or absent. Bilateral PCA branches are within normal limits. Anterior circulation: Both ICA siphons are patent. No siphon plaque or stenosis. Patent carotid termini, MCA and ACA origins. Mildly tortuous ACAs. Diminutive or absent anterior communicating artery. Bilateral ACA branches are within normal limits. Left MCA M1 segment and bifurcation are patent without stenosis. Left MCA branches are within normal limits. Right MCA M1 segment is patent with no stenosis. Right MCA bifurcation is patent without stenosis. No discrete right MCA branch occlusion is identified, although the right M3 branches appear diminutive compared to those on the left. On series 9, image 27 there is subtle irregularity along the dorsal right M1 which is not correlated on the other 60 series and suspected to be artifact. Venous sinuses: Early contrast timing, grossly patent. Anatomic variants: None. Review of the MIP images confirms the above findings IMPRESSION: 1. Positive for 3-4 mm focus of adherent thrombus or soft plaque in the medial Right ICA bulb. No large vessel occlusion, but attenuated appearance of the Right MCA M3 branches corresponds to CT Perfusion finding of 9 mL estimated penumbra in the posterior Right MCA territory. No core infarct detected by CTP. 2. No atherosclerosis elsewhere in the head and neck. Tortuous proximal great vessels. 3. Study discussed by telephone  with Dr. Wilford Corner on 08/26/2020 at 0837 hours. Electronically Signed   By: Odessa Fleming M.D.   On: 08/26/2020 08:50   DG CHEST PORT 1 VIEW  Result Date: 08/27/2020 CLINICAL DATA:  Hypoxia EXAM: PORTABLE CHEST 1 VIEW COMPARISON:  None. FINDINGS: The heart size and mediastinal contours are within normal limits. Both lungs are clear. The visualized skeletal structures are unremarkable. IMPRESSION: No active disease. Electronically Signed   By: Deatra Robinson M.D.   On: 08/27/2020 00:32   ECHOCARDIOGRAM COMPLETE BUBBLE STUDY  Result  Date: 08/27/2020    ECHOCARDIOGRAM REPORT   Patient Name:   AKSHAJ BESANCON Date of Exam: 08/27/2020 Medical Rec #:  [id number]       Height:       64.0 in Accession #:    [id number]      Weight:       238.8 lb Date of Birth:  05-11-1965      BSA:          2.109 m Patient Age:    54 years        BP:           163/85 mmHg Patient Gender: M               HR:           67 bpm. Exam Location:  Inpatient Procedure: 2D Echo and Saline Contrast Bubble Study Indications:    stroke  History:        Patient has no prior history of Echocardiogram examinations.                 Risk Factors:Sleep Apnea.  Sonographer:    Delcie Roch Referring Phys: [id number] SROBONA TUBLU CHATTERJEE IMPRESSIONS  1. Left ventricular ejection fraction, by estimation, is 55 to 60%. The left ventricle has normal function. The left ventricle has no regional wall motion abnormalities. Left ventricular diastolic parameters are consistent with Grade I diastolic dysfunction (impaired relaxation).  2. Right ventricular systolic function is normal. The right ventricular size is normal.  3. The mitral valve is grossly normal. No evidence of mitral valve regurgitation.  4. The aortic valve is tricuspid. Aortic valve regurgitation is not visualized.  5. The inferior vena cava is normal in size with greater than 50% respiratory variability, suggesting right atrial pressure of 3 mmHg.  6. Agitated saline contrast bubble study was negative, with no evidence of any interatrial shunt. Comparison(s): No prior Echocardiogram. FINDINGS  Left Ventricle: Left ventricular ejection fraction, by estimation, is 55 to 60%. The left ventricle has normal function. The left ventricle has no regional wall motion abnormalities. The left ventricular internal cavity size was normal in size. There is  no left ventricular hypertrophy. Left ventricular diastolic parameters are consistent with Grade I diastolic dysfunction (impaired relaxation). Indeterminate filling pressures. Right  Ventricle: The right ventricular size is normal. No increase in right ventricular wall thickness. Right ventricular systolic function is normal. Left Atrium: Left atrial size was normal in size. Right Atrium: Right atrial size was normal in size. Pericardium: There is no evidence of pericardial effusion. Mitral Valve: The mitral valve is grossly normal. No evidence of mitral valve regurgitation. Tricuspid Valve: The tricuspid valve is grossly normal. Tricuspid valve regurgitation is trivial. Aortic Valve: The aortic valve is tricuspid. Aortic valve regurgitation is not visualized. Pulmonic Valve: The pulmonic valve was normal in structure. Pulmonic valve regurgitation is not visualized. Aorta: The aortic root and ascending aorta are structurally normal, with no evidence  of dilitation. Venous: The inferior vena cava is normal in size with greater than 50% respiratory variability, suggesting right atrial pressure of 3 mmHg. IAS/Shunts: No atrial level shunt detected by color flow Doppler. Agitated saline contrast was given intravenously to evaluate for intracardiac shunting. Agitated saline contrast bubble study was negative, with no evidence of any interatrial shunt.  LEFT VENTRICLE PLAX 2D LVIDd:         5.80 cm  Diastology LVIDs:         4.40 cm  LV e' medial:    5.98 cm/s LV PW:         1.10 cm  LV E/e' medial:  9.9 LV IVS:        0.90 cm  LV e' lateral:   9.57 cm/s LVOT diam:     1.90 cm  LV E/e' lateral: 6.2 LV SV:         67 LV SV Index:   32 LVOT Area:     2.84 cm  RIGHT VENTRICLE         IVC TAPSE (M-mode): 1.5 cm  IVC diam: 1.40 cm LEFT ATRIUM             Index       RIGHT ATRIUM           Index LA diam:        3.40 cm 1.61 cm/m  RA Area:     13.00 cm LA Vol (A2C):   35.9 ml 17.02 ml/m RA Volume:   28.20 ml  13.37 ml/m LA Vol (A4C):   43.4 ml 20.58 ml/m LA Biplane Vol: 41.2 ml 19.53 ml/m  AORTIC VALVE LVOT Vmax:   138.00 cm/s LVOT Vmean:  85.700 cm/s LVOT VTI:    0.237 m  AORTA Ao Root diam: 3.30 cm  Ao Asc diam:  3.30 cm MITRAL VALVE MV Area (PHT): 3.30 cm    SHUNTS MV Decel Time: 230 msec    Systemic VTI:  0.24 m MV E velocity: 59.40 cm/s  Systemic Diam: 1.90 cm MV A velocity: 81.40 cm/s MV E/A ratio:  0.73 Zoila ShutterKenneth Hilty MD Electronically signed by Zoila ShutterKenneth Hilty MD Signature Date/Time: 08/27/2020/3:21:35 PM    Final    CT HEAD CODE STROKE WO CONTRAST  Result Date: 08/26/2020 CLINICAL DATA:  Code stroke. 55 year old male with sudden onset dizziness and weakness at 2200 hours. Slurred speech. EXAM: CT HEAD WITHOUT CONTRAST TECHNIQUE: Contiguous axial images were obtained from the base of the skull through the vertex without intravenous contrast. COMPARISON:  None. FINDINGS: Brain: Normal cerebral volume. No midline shift, ventriculomegaly, mass effect, evidence of mass lesion, intracranial hemorrhage or evidence of cortically based acute infarction. Faint vascular calcifications at the basal ganglia. Gray-white matter differentiation is within normal limits throughout the brain. No encephalomalacia identified. Vascular: No suspicious intracranial vascular hyperdensity. Skull: No acute osseous abnormality identified. Sinuses/Orbits: Mild maxillary alveolar recess mucosal thickening. Otherwise clear. Tympanic cavities and mastoids are clear. Other: No acute orbit or scalp soft tissue finding. ASPECTS [facility]([place] Stroke Program Early CT Score) Total score (0-10 with 10 being normal): 10. IMPRESSION: 1. Normal noncontrast CT appearance of the brain.  ASPECTS 10. 2. These results were communicated to Dr. Wilford CornerArora at 8:16 am on 08/26/2020 by text page via the [facility] messaging system. Electronically Signed   By: Odessa FlemingH  Hall M.D.   On: 08/26/2020 08:16   CT ANGIO HEAD NECK W WO CM (CODE STROKE)  Result Date: 08/26/2020 CLINICAL DATA:  55 year old male code stroke  presentation with left side symptoms. EXAM: CT ANGIOGRAPHY HEAD AND NECK CT PERFUSION BRAIN TECHNIQUE: Multidetector CT imaging of the head and neck was performed  using the standard protocol during bolus administration of intravenous contrast. Multiplanar CT image reconstructions and MIPs were obtained to evaluate the vascular anatomy. Carotid stenosis measurements (when applicable) are obtained utilizing NASCET criteria, using the distal internal carotid diameter as the denominator. Multiphase CT imaging of the brain was performed following IV bolus contrast injection. Subsequent parametric perfusion maps were calculated using RAPID software. CONTRAST:  OMNIPAQUE IOHEXOL 350 MG/ML SOLN COMPARISON:  Plain head CT 0812 hours today. FINDINGS: CT Brain Perfusion Findings: ASPECTS: 10 CBF (<30%) Volume: 0mL Perfusion (Tmax>6.0s) volume: 9 mL.  Hypoperfusion index 0.0. Mismatch Volume: 9 mLmL Infarction Location:Ischemia in the posterior right MCA territory. CTA NECK Skeleton: Intermittent carious dentition. No acute osseous abnormality identified. Upper chest: Negative aside from minor atelectasis. Other neck: Negative. Aortic arch: 3 vessel arch configuration. No arch atherosclerosis. Mildly tortuous great vessels. Right carotid system: Mild brachiocephalic and proximal right CCA tortuosity. Negative right CCA. Patent right carotid bifurcation with rounded roughly 3-4 mm area of soft plaque or adherent thrombus at the medial right ICA bulb on series 3, image 116. No other atherosclerosis. And patent right ICA to the skull base without stenosis. Left carotid system: Negative aside from tortuosity. Vertebral arteries: Negative aside from tortuous proximal subclavian arteries, left vertebral artery V1 segment. Codominant vertebrals. CTA HEAD Posterior circulation: Codominant distal vertebral arteries with tortuosity to the vertebrobasilar junction, but no plaque or stenosis. Both PICA origins are patent. Patent basilar artery with mild tortuosity. No stenosis. Patent SCA and PCA origins. Posterior communicating arteries are diminutive or absent. Bilateral PCA branches are  within normal limits. Anterior circulation: Both ICA siphons are patent. No siphon plaque or stenosis. Patent carotid termini, MCA and ACA origins. Mildly tortuous ACAs. Diminutive or absent anterior communicating artery. Bilateral ACA branches are within normal limits. Left MCA M1 segment and bifurcation are patent without stenosis. Left MCA branches are within normal limits. Right MCA M1 segment is patent with no stenosis. Right MCA bifurcation is patent without stenosis. No discrete right MCA branch occlusion is identified, although the right M3 branches appear diminutive compared to those on the left. On series 9, image 27 there is subtle irregularity along the dorsal right M1 which is not correlated on the other 60 series and suspected to be artifact. Venous sinuses: Early contrast timing, grossly patent. Anatomic variants: None. Review of the MIP images confirms the above findings IMPRESSION: 1. Positive for 3-4 mm focus of adherent thrombus or soft plaque in the medial Right ICA bulb. No large vessel occlusion, but attenuated appearance of the Right MCA M3 branches corresponds to CT Perfusion finding of 9 mL estimated penumbra in the posterior Right MCA territory. No core infarct detected by CTP. 2. No atherosclerosis elsewhere in the head and neck. Tortuous proximal great vessels. 3. Study discussed by telephone with Dr. Wilford Corner on 08/26/2020 at 0837 hours. Electronically Signed   By: Odessa Fleming M.D.   On: 08/26/2020 08:50      Subjective:  He denies any complaints today, reports significant improvement of his left-sided weakness   Discharge Exam: Vitals:   08/29/20 0612 08/29/20 0729  BP: (!) 144/96 (!) 144/94  Pulse: 62 75  Resp: 18 19  Temp: 98.4 F (36.9 C) 98.4 F (36.9 C)  SpO2: 100% 93%   Vitals:   08/29/20 0051 08/29/20 0419 08/29/20 0612 08/29/20  0729  BP:   (!) 144/96 (!) 144/94  Pulse: (!) 57 80 62 75  Resp:   18 19  Temp:   98.4 F (36.9 C) 98.4 F (36.9 C)  TempSrc:   Oral  Oral  SpO2: 95% 95% 100% 93%  Weight:      Height:        General: Pt is alert, awake, not in acute distress Cardiovascular: RRR, S1/S2 +, no rubs, no gallops Respiratory: CTA bilaterally, no wheezing, no rhonchi Abdominal: Soft, NT, ND, bowel sounds + Extremities: no edema, no cyanosis    The results of significant diagnostics from this hospitalization (including imaging, microbiology, ancillary and laboratory) are listed below for reference.     Microbiology: Recent Results (from the past 240 hour(s))  Resp Panel by RT-PCR (Flu A&B, Covid) Nasopharyngeal Swab     Status: None   Collection Time: 08/26/20  9:57 AM   Specimen: Nasopharyngeal Swab; Nasopharyngeal(NP) swabs in vial transport medium  Result Value Ref Range Status   SARS Coronavirus 2 by RT PCR NEGATIVE NEGATIVE Final    Comment: (NOTE) SARS-CoV-2 target nucleic acids are NOT DETECTED.  The SARS-CoV-2 RNA is generally detectable in upper respiratory specimens during the acute phase of infection. The lowest concentration of SARS-CoV-2 viral copies this assay can detect is 138 copies/mL. A negative result does not preclude SARS-Cov-2 infection and should not be used as the sole basis for treatment or other patient management decisions. A negative result may occur with  improper specimen collection/handling, submission of specimen other than nasopharyngeal swab, presence of viral mutation(s) within the areas targeted by this assay, and inadequate number of viral copies(<138 copies/mL). A negative result must be combined with clinical observations, patient history, and epidemiological information. The expected result is Negative.  Fact Sheet for Patients:  BloggerCourse.com  Fact Sheet for Healthcare Providers:  SeriousBroker.it  This test is no t yet approved or cleared by the [place] FDA and  has been authorized for detection and/or diagnosis of SARS-CoV-2  by FDA under an Emergency Use Authorization (EUA). This EUA will remain  in effect (meaning this test can be used) for the duration of the COVID-19 declaration under Section 564(b)(1) of the Act, 21 U.S.C.section 360bbb-3(b)(1), unless the authorization is terminated  or revoked sooner.       Influenza A by PCR NEGATIVE NEGATIVE Final   Influenza B by PCR NEGATIVE NEGATIVE Final    Comment: (NOTE) The Xpert Xpress SARS-CoV-2/FLU/RSV plus assay is intended as an aid in the diagnosis of influenza from Nasopharyngeal swab specimens and should not be used as a sole basis for treatment. Nasal washings and aspirates are unacceptable for Xpert Xpress SARS-CoV-2/FLU/RSV testing.  Fact Sheet for Patients: BloggerCourse.com  Fact Sheet for Healthcare Providers: SeriousBroker.it  This test is not yet approved or cleared by the [place] FDA and has been authorized for detection and/or diagnosis of SARS-CoV-2 by FDA under an Emergency Use Authorization (EUA). This EUA will remain in effect (meaning this test can be used) for the duration of the COVID-19 declaration under Section 564(b)(1) of the Act, 21 U.S.C. section 360bbb-3(b)(1), unless the authorization is terminated or revoked.  Performed at [facility], [address], [place], [place] [address]      Labs: BNP (last 3 results) No results for input(s): BNP in the last 8760 hours. Basic Metabolic Panel: Recent Labs  Lab 08/26/20 0739 08/26/20 0747 08/28/20 0037  NA 137 140 137  K 3.5 3.5 3.3*  CL 103 102 105  CO2 26  --  25  GLUCOSE 164* 164* 135*  BUN 7 7 14   CREATININE 0.84 0.70 0.87  CALCIUM 8.9  --  8.9   Liver Function Tests: Recent Labs  Lab 08/26/20 0739  AST 19  ALT 29  ALKPHOS 82  BILITOT 0.6  PROT 6.9  ALBUMIN 3.7   No results for input(s): LIPASE, AMYLASE in the last 168 hours. No results for input(s): AMMONIA in the last 168  hours. CBC: Recent Labs  Lab 08/26/20 0739 08/26/20 0747 08/27/20 0009 08/28/20 0037 08/29/20 0028  WBC 9.0  --  8.9 8.0 6.0  NEUTROABS 6.3  --   --   --   --   HGB 15.0 15.0 14.7 14.2 14.0  HCT 42.9 44.0 42.6 40.7 40.1  MCV 88.3  --  88.2 89.5 90.3  PLT 338  --  307 264 331   Cardiac Enzymes: No results for input(s): CKTOTAL, CKMB, CKMBINDEX, TROPONINI in the last 168 hours. BNP: Invalid input(s): POCBNP CBG: Recent Labs  Lab 08/26/20 0743 08/28/20 0443  GLUCAP 160* 130*   D-Dimer No results for input(s): DDIMER in the last 72 hours. Hgb A1c Recent Labs    08/27/20 0009  HGBA1C 6.9*   Lipid Profile Recent Labs    08/27/20 0009  CHOL 201*  HDL 29*  LDLCALC 145*  TRIG 136  CHOLHDL 6.9   Thyroid function studies No results for input(s): TSH, T4TOTAL, T3FREE, THYROIDAB in the last 72 hours.  Invalid input(s): FREET3 Anemia work up No results for input(s): VITAMINB12, FOLATE, FERRITIN, TIBC, IRON, RETICCTPCT in the last 72 hours. Urinalysis    Component Value Date/Time   COLORURINE YELLOW 08/26/2020 0957   APPEARANCEUR CLEAR 08/26/2020 0957   LABSPEC 1.038 (H) 08/26/2020 0957   PHURINE 7.0 08/26/2020 0957   GLUCOSEU NEGATIVE 08/26/2020 0957   HGBUR NEGATIVE 08/26/2020 0957   BILIRUBINUR NEGATIVE 08/26/2020 0957   KETONESUR NEGATIVE 08/26/2020 0957   PROTEINUR NEGATIVE 08/26/2020 0957   NITRITE NEGATIVE 08/26/2020 0957   LEUKOCYTESUR NEGATIVE 08/26/2020 0957   Sepsis Labs Invalid input(s): PROCALCITONIN,  WBC,  LACTICIDVEN Microbiology Recent Results (from the past 240 hour(s))  Resp Panel by RT-PCR (Flu A&B, Covid) Nasopharyngeal Swab     Status: None   Collection Time: 08/26/20  9:57 AM   Specimen: Nasopharyngeal Swab; Nasopharyngeal(NP) swabs in vial transport medium  Result Value Ref Range Status   SARS Coronavirus 2 by RT PCR NEGATIVE NEGATIVE Final    Comment: (NOTE) SARS-CoV-2 target nucleic acids are NOT DETECTED.  The SARS-CoV-2 RNA is  generally detectable in upper respiratory specimens during the acute phase of infection. The lowest concentration of SARS-CoV-2 viral copies this assay can detect is 138 copies/mL. A negative result does not preclude SARS-Cov-2 infection and should not be used as the sole basis for treatment or other patient management decisions. A negative result may occur with  improper specimen collection/handling, submission of specimen other than nasopharyngeal swab, presence of viral mutation(s) within the areas targeted by this assay, and inadequate number of viral copies(<138 copies/mL). A negative result must be combined with clinical observations, patient history, and epidemiological information. The expected result is Negative.  Fact Sheet for Patients:  10/27/20  Fact Sheet for Healthcare Providers:  BloggerCourse.com  This test is no t yet approved or cleared by the [place] FDA and  has been authorized for detection and/or diagnosis of SARS-CoV-2 by FDA under an Emergency Use Authorization (EUA). This EUA  will remain  in effect (meaning this test can be used) for the duration of the COVID-19 declaration under Section 564(b)(1) of the Act, 21 U.S.C.section 360bbb-3(b)(1), unless the authorization is terminated  or revoked sooner.       Influenza A by PCR NEGATIVE NEGATIVE Final   Influenza B by PCR NEGATIVE NEGATIVE Final    Comment: (NOTE) The Xpert Xpress SARS-CoV-2/FLU/RSV plus assay is intended as an aid in the diagnosis of influenza from Nasopharyngeal swab specimens and should not be used as a sole basis for treatment. Nasal washings and aspirates are unacceptable for Xpert Xpress SARS-CoV-2/FLU/RSV testing.  Fact Sheet for Patients: BloggerCourse.com  Fact Sheet for Healthcare Providers: SeriousBroker.it  This test is not yet approved or cleared by the [place] FDA and has been authorized for detection and/or diagnosis of SARS-CoV-2 by FDA under an Emergency Use Authorization (EUA). This EUA will remain in effect (meaning this test can be used) for the duration of the COVID-19 declaration under Section 564(b)(1) of the Act, 21 U.S.C. section 360bbb-3(b)(1), unless the authorization is terminated or revoked.  Performed at [facility], [address] [place], [place] [address]      Time coordinating discharge: Over 30 minutes  SIGNED:   Huey Bienenstock, MD  [facility] 08/29/2020, 11:50 AM Pager   If 7PM-7AM, please contact night-coverage [email or web address] Password [email or web address]

## 2020-08-29 NOTE — Progress Notes (Signed)
Physical Therapy Treatment Patient Details Name: Perry Hughes MRN: [id number] DOB: 1965-11-02 Today's Date: 08/29/2020    History of Present Illness Pt is a 55 yo male s/p Several small acute infarcts of the right MCA territory involving frontoparietal lobes and insula. Suspect small area of thrombus  within a distal M2/M3 branch. Pt had L sided weakness and slurred speech/difficulty swallowing. PMHx: HTN, DM    PT Comments    Patient received at EOB, pleasant and cooperative. Continues to progress well- only needs general supervision for gait, and was able to complete about 20 steps  with L rail and no more than min guard to light MinA today. VSS on RA. Very motivated to continue improving. Did have some trouble getting socks on but spouse states this is baseline. Left sitting at EOB with all needs met, family present. Continue to recommend skilled OP PT f/u.    Follow Up Recommendations  Outpatient PT     Equipment Recommendations  None recommended by PT    Recommendations for Other Services       Precautions / Restrictions Precautions Precautions: Fall Restrictions Weight Bearing Restrictions: No    Mobility  Bed Mobility               General bed mobility comments: sitting at EOB upon entry    Transfers Overall transfer level: Independent Equipment used: None Transfers: Sit to/from Stand Sit to Stand: Independent         General transfer comment: independent, good awareness of safety and mechanics  Ambulation/Gait Ambulation/Gait assistance: Supervision Gait Distance (Feet): 250 Feet Assistive device: None Gait Pattern/deviations: Step-to pattern Gait velocity: WFL   General Gait Details: general S for safety, but he was steady and stable with all mobility today   Stairs Stairs: Yes Stairs assistance: Min assist Stair Management: One rail Left;Forwards Number of Stairs: 20 General stair comments: able to do multiple flights of stairs with  light min guard for ascent with L rail, did need very light MinA for balance/steadying with descent but was very safe with step navigation   Wheelchair Mobility    Modified Rankin (Stroke Patients Only)       Balance Overall balance assessment: Mild deficits observed, not formally tested                                          Cognition Arousal/Alertness: Awake/alert Behavior During Therapy: WFL for tasks assessed/performed Overall Cognitive Status: Within Functional Limits for tasks assessed                                        Exercises      General Comments General comments (skin integrity, edema, etc.): family in room and acted as interpreter      Pertinent Vitals/Pain Pain Assessment: No/denies pain    Home Living                      Prior Function            PT Goals (current goals can now be found in the care plan section) Acute Rehab PT Goals Patient Stated Goal: not stated by pt, home per wife PT Goal Formulation: With patient/family Time For Goal Achievement: 09/10/20 Potential to Achieve Goals: Good Progress towards PT goals: Progressing  toward goals    Frequency    Min 4X/week      PT Plan Current plan remains appropriate    Co-evaluation              AM-PAC PT "6 Clicks" Mobility   Outcome Measure  Help needed turning from your back to your side while in a flat bed without using bedrails?: None Help needed moving from lying on your back to sitting on the side of a flat bed without using bedrails?: None Help needed moving to and from a bed to a chair (including a wheelchair)?: None Help needed standing up from a chair using your arms (e.g., wheelchair or bedside chair)?: None Help needed to walk in hospital room?: None Help needed climbing 3-5 steps with a railing? : A Little 6 Click Score: 23    End of Session   Activity Tolerance: Patient tolerated treatment well Patient left: in  bed;with call bell/phone within reach;with family/visitor present (sitting at EOB per his request) Nurse Communication: Mobility status PT Visit Diagnosis: Unsteadiness on feet (R26.81);Muscle weakness (generalized) (M62.81)     Time: 0950-1003 PT Time Calculation (min) (ACUTE ONLY): 13 min  Charges:  $Gait Training: 8-22 mins                    Windell [place], DPT, PN1   Supplemental Physical Therapist [place]    Pager [phone] Acute Rehab Office [phone]

## 2020-08-29 NOTE — Discharge Instructions (Signed)
Follow with Primary MD Georgina Quint, MD in 7 days   Get CBC, CMP,  checked  by Primary MD next visit.    Activity: As tolerated with Full fall precautions use walker/cane & assistance as needed   Disposition Home    Diet: Heart Healthy / carb modified   On your next visit with your primary care physician please Get Medicines reviewed and adjusted.   Please request your Prim.MD to go over all Hospital Tests and Procedure/Radiological results at the follow up, please get all Hospital records sent to your Prim MD by signing hospital release before you go home.   If you experience worsening of your admission symptoms, develop shortness of breath, life threatening emergency, suicidal or homicidal thoughts you must seek medical attention immediately by calling 911 or calling your MD immediately  if symptoms less severe.  You Must read complete instructions/literature along with all the possible adverse reactions/side effects for all the Medicines you take and that have been prescribed to you. Take any new Medicines after you have completely understood and accpet all the possible adverse reactions/side effects.   Do not drive when taking Pain medications.    Do not take more than prescribed Pain, Sleep and Anxiety Medications  Special Instructions: If you have smoked or chewed Tobacco  in the last 2 yrs please stop smoking, stop any regular Alcohol  and or any Recreational drug use.  Wear Seat belts while driving.   Please note  You were cared for by a hospitalist during your hospital stay. If you have any questions about your discharge medications or the care you received while you were in the hospital after you are discharged, you can call the unit and asked to speak with the hospitalist on call if the hospitalist that took care of you is not available. Once you are discharged, your primary care physician will handle any further medical issues. Please note that NO REFILLS for  any discharge medications will be authorized once you are discharged, as it is imperative that you return to your primary care physician (or establish a relationship with a primary care physician if you do not have one) for your aftercare needs so that they can reassess your need for medications and monitor your lab values.

## 2020-08-29 NOTE — Progress Notes (Signed)
Patient discharged from unit at this time. All discharge instructions reviewed with patient and patient wife. All follow up appointments reviewed with patient and wife, verbalized understanding from both parties. All medications reviewed with patient at bedside. Patient verbalized understanding. IV sites removed, catheters intact, pressure dressings applied. All patient belongings in disposition of the patient. No further needs at this time.

## 2020-08-29 NOTE — TOC Transition Note (Signed)
Transition of Care [facility]) - CM/SW Discharge Note   Patient Details  Name: Jamine Highfill MRN: [id number] Date of Birth: 30-Aug-1965  Transition of Care [facility]) CM/SW Contact:  Kermit Balo, RN Phone Number: 08/29/2020, 12:13 PM   Clinical Narrative:    Patient discharging home with outpatient therapy arranged through [facility].  PCP appt on AVS.  Medications for home delivered to the room per [facility] pharmacy.  Pt has supervision and transport home.   Final next level of care: OP Rehab Barriers to Discharge: Inadequate or no insurance, Barriers Unresolved (comment)   Patient Goals and CMS Choice     Choice offered to / list presented to : Patient  Discharge Placement                       Discharge Plan and Services   Discharge Planning Services: CM Consult                                 Social Determinants of Health (SDOH) Interventions     Readmission Risk Interventions No flowsheet data found.

## 2020-09-04 ENCOUNTER — Ambulatory Visit: Payer: Self-pay | Admitting: *Deleted

## 2020-09-04 NOTE — Telephone Encounter (Signed)
Pt called in on the community line using a Spanish interpreter.    He was in the hospital for a "brain leak".   He is now having dizziness and weakness.   He does not have a PCP.   He has an appt to be established with [facility] in August 2022 but has never been seen there.  I referred him to the ED.    He asked me if I knew what was wrong with him and what is causing the dizziness.   I let him know I could not diagnose what is wrong with him over the phone and that he would need to see a health care provider to find out what is wrong with him.    He then mentioned he would go to his dr tomorrow and if he doesn't like what he says he will go on to the ED.   He mentioned he used to go to a dr but that he didn't trust him.   (In chart it looks like he went to Dr. Edwina Barth at [facility] on [address] which closed back in May 19, 2020).    Not sure which dr he is seeing tomorrow however I encouraged him to go on to the ED since he was just in the hospital and with his symptoms.    Not sure if he is going or not.   (

## 2020-09-04 NOTE — Telephone Encounter (Signed)
Reason for Disposition  Patient sounds very sick or weak to the triager    Pt said he was in the hospital for a "brain leak".  Now having dizziness and weakness.   Referred to the ED.  Answer Assessment - Initial Assessment Questions 1. DESCRIPTION: "Describe your dizziness."    I came out the hospital a week ago.   I feel like I have bitter taste in my mouth.  I feel dizzy and without strength.   In hospital for brain leak.   I been to a dr twice and I don't trust him.   So no dr. Now.    2. LIGHTHEADED: "Do you feel lightheaded?" (e.g., somewhat faint, woozy, weak upon standing)     Dizzy   I will see my dr tomorrow.   If I'm not happy with him I will go to the ED.    The triage ended here. 3. VERTIGO: "Do you feel like either you or the room is spinning or tilting?" (i.e. vertigo)     *No Answer* 4. SEVERITY: "How bad is it?"  "Do you feel like you are going to faint?" "Can you stand and walk?"   - MILD: Feels slightly dizzy, but walking normally.   - MODERATE: Feels unsteady when walking, but not falling; interferes with normal activities (e.g., school, work).   - SEVERE: Unable to walk without falling, or requires assistance to walk without falling; feels like passing out now.      *No Answer* 5. ONSET:  "When did the dizziness begin?"     *No Answer* 6. AGGRAVATING FACTORS: "Does anything make it worse?" (e.g., standing, change in head position)     *No Answer* 7. HEART RATE: "Can you tell me your heart rate?" "How many beats in 15 seconds?"  (Note: not all patients can do this)       *No Answer* 8. CAUSE: "What do you think is causing the dizziness?"     *No Answer* 9. RECURRENT SYMPTOM: "Have you had dizziness before?" If Yes, ask: "When was the last time?" "What happened that time?"     *No Answer* 10. OTHER SYMPTOMS: "Do you have any other symptoms?" (e.g., fever, chest pain, vomiting, diarrhea, bleeding)       *No Answer* 11. PREGNANCY: "Is there any chance you are pregnant?"  "When was your last menstrual period?"       *No Answer*  Protocols used: Dizziness - Lightheadedness-A-AH

## 2020-09-21 ENCOUNTER — Other Ambulatory Visit (HOSPITAL_COMMUNITY): Payer: Self-pay

## 2020-09-26 ENCOUNTER — Encounter (INDEPENDENT_AMBULATORY_CARE_PROVIDER_SITE_OTHER): Payer: Self-pay | Admitting: Primary Care

## 2020-09-26 ENCOUNTER — Ambulatory Visit (INDEPENDENT_AMBULATORY_CARE_PROVIDER_SITE_OTHER): Payer: Self-pay | Admitting: Primary Care

## 2020-09-26 ENCOUNTER — Other Ambulatory Visit: Payer: Self-pay

## 2020-09-26 VITALS — BP 170/90 | HR 84 | Temp 98.3°F | Resp 18 | Ht 66.0 in | Wt 235.0 lb

## 2020-09-26 DIAGNOSIS — Z09 Encounter for follow-up examination after completed treatment for conditions other than malignant neoplasm: Secondary | ICD-10-CM

## 2020-09-26 DIAGNOSIS — Z6837 Body mass index (BMI) 37.0-37.9, adult: Secondary | ICD-10-CM

## 2020-09-26 DIAGNOSIS — I63311 Cerebral infarction due to thrombosis of right middle cerebral artery: Secondary | ICD-10-CM

## 2020-09-26 DIAGNOSIS — Z7689 Persons encountering health services in other specified circumstances: Secondary | ICD-10-CM

## 2020-09-26 DIAGNOSIS — Z760 Encounter for issue of repeat prescription: Secondary | ICD-10-CM

## 2020-09-26 DIAGNOSIS — E6609 Other obesity due to excess calories: Secondary | ICD-10-CM

## 2020-09-26 DIAGNOSIS — I10 Essential (primary) hypertension: Secondary | ICD-10-CM

## 2020-09-26 MED ORDER — ATORVASTATIN CALCIUM 80 MG PO TABS: 80.0000 mg | ORAL_TABLET | Freq: Every evening | ORAL | 1 refills | Status: DC

## 2020-09-26 MED ORDER — METFORMIN HCL 500 MG PO TABS: 500.0000 mg | ORAL_TABLET | Freq: Two times a day (BID) | ORAL | 2 refills | Status: DC

## 2020-09-26 MED ORDER — HYDROCHLOROTHIAZIDE 25 MG PO TABS: 25.0000 mg | ORAL_TABLET | Freq: Every day | ORAL | 3 refills | Status: DC

## 2020-09-26 MED ORDER — AMLODIPINE BESYLATE 10 MG PO TABS: 10.0000 mg | ORAL_TABLET | Freq: Every day | ORAL | 0 refills | Status: DC

## 2020-09-26 MED ORDER — LISINOPRIL 20 MG PO TABS: 20.0000 mg | ORAL_TABLET | Freq: Every morning | ORAL | 2 refills | Status: DC

## 2020-09-26 MED ORDER — CLOPIDOGREL BISULFATE 75 MG PO TABS: 75.0000 mg | ORAL_TABLET | Freq: Every day | ORAL | 0 refills | Status: DC

## 2020-09-26 NOTE — Progress Notes (Signed)
[facility]   Subjective:   Perry Hughes is a 55 y.o. male presents for hospital follow up and establish care. Presented to the ED after he realized that he did not know how to move his mouth when he was eating, slurred speech left lower extremity and upper extremity weakness .Admit date to the hospital was 08/26/20, patient was discharged from the hospital on 08/29/20, patient was admitted for:  Stroke [facility]) and .HTN (hypertension.   Past Medical History:  Diagnosis Date   Diabetes mellitus without complication (HCC)    Hypertension      No Known Allergies    Current Outpatient Medications on File Prior to Visit  Medication Sig Dispense Refill   aspirin 81 MG EC tablet Take 1 tablet (81 mg total) by mouth daily. Swallow whole. 30 tablet 2   No current facility-administered medications on file prior to visit.     Review of System: Review of Systems  Neurological:  Positive for tingling, speech change, weakness and headaches.  All other systems reviewed and are negative.  Objective:  BP (!) 170/90 (BP Location: Right Arm, Patient Position: Sitting, Cuff Size: Large)   Pulse 84   Temp 98.3 F (36.8 C) (Oral)   Resp 18   Ht 5\' 6"  (1.676 m)   Wt 235 lb (106.6 kg)   SpO2 98%   BMI 37.93 kg/m    Physical Exam: General Appearance: Well nourished, obese male in no apparent distress. Eyes: PERRLA, EOMs, conjunctiva no swelling or erythema Sinuses: No Frontal/maxillary tenderness ENT/Mouth: Ext aud canals clear, TMs without erythema, bulging. No erythema, swelling, or exudate on post pharynx.  Tonsils not swollen or erythematous. Hearing normal.  Neck: Supple, thyroid normal.  Respiratory: Respiratory effort normal, BS equal bilaterally without rales, rhonchi, wheezing or stridor.  Cardio: RRR with no MRGs. Brisk peripheral pulses without edema.  Abdomen: Soft, + BS.  Non tender, no guarding, rebound, hernias, masses. Lymphatics: Non tender without  lymphadenopathy.  Musculoskeletal: Full ROM, 4/5 strength, normal gait.  Skin: Warm, dry without rashes, lesions, ecchymosis.  Neuro: Cranial nerves intact. Normal muscle tone, no cerebellar symptoms. Sensation intact.  Psych: Awake and oriented X 3, normal affect, Insight and Judgment appropriate.    Assessment:  Diagnoses and all orders for this visit:  Essential hypertension Counseled on blood pressure goal of less than 130/80, low-sodium, DASH diet, medication compliance, 150 minutes of moderate intensity exercise per week. Discussed medication compliance, adverse effects.  -     lisinopril (ZESTRIL) 20 MG tablet; Take 1 tablet (20 mg total) by mouth every morning. -     amLODipine (NORVASC) 10 MG tablet; Take 1 tablet (10 mg total) by mouth daily. -     hydrochlorothiazide (HYDRODIURIL) 25 MG tablet; Take 1 tablet (25 mg total) by mouth daily.  Cerebrovascular accident (CVA) due to thrombosis of right middle cerebral artery (HCC) Continue Plavix  Ambulatory referral to Neurology   Class 2 obesity due to excess calories without serious comorbidity with body mass index (BMI) of 37.0 to 37.9 in adult Obesity is 30-39 indicating an excess in caloric intake or underlining conditions. This may lead to other co-morbidities. Lifestyle modifications of diet and exercise may reduce obesity.   Encounter to establish care Establish care  Hospital discharge follow-up Retrieved from hospital discharge Schedule an appointment with [facility] (Rehabilitation) in 1 week (09/05/2020)- Reminded to schedule appt. Pt/family thought they would contact them  Schedule an appointment with [facility] (Neurology) in  4 weeks (09/26/2020) Follow up with [place] [facility] on 09/26/2020; at 1:30pm for hospital follow-up completed  Other orders/Medication refill -     atorvastatin (LIPITOR) 80 MG tablet; Take 1 tablet (80  mg total) by mouth every evening. -     clopidogrel (PLAVIX) 75 MG tablet; Take 1 tablet (75 mg total) by mouth daily. -     metFORMIN (GLUCOPHAGE) 500 MG tablet; Take 1 tablet (500 mg total) by mouth 2 (two) times daily with a meal.   Meds ordered this encounter  Medications   lisinopril (ZESTRIL) 20 MG tablet    Sig: Take 1 tablet (20 mg total) by mouth every morning.    Dispense:  90 tablet    Refill:  2   amLODipine (NORVASC) 10 MG tablet    Sig: Take 1 tablet (10 mg total) by mouth daily.    Dispense:  90 tablet    Refill:  0   hydrochlorothiazide (HYDRODIURIL) 25 MG tablet    Sig: Take 1 tablet (25 mg total) by mouth daily.    Dispense:  90 tablet    Refill:  3   atorvastatin (LIPITOR) 80 MG tablet    Sig: Take 1 tablet (80 mg total) by mouth every evening.    Dispense:  90 tablet    Refill:  1   clopidogrel (PLAVIX) 75 MG tablet    Sig: Take 1 tablet (75 mg total) by mouth daily.    Dispense:  90 tablet    Refill:  0   metFORMIN (GLUCOPHAGE) 500 MG tablet    Sig: Take 1 tablet (500 mg total) by mouth 2 (two) times daily with a meal.    Dispense:  180 tablet    Refill:  2    This note has been created with Education officer, environmental. Any transcriptional errors are unintentional.   Grayce Sessions, NP 10/02/2020, 10:50 PM

## 2020-09-26 NOTE — Patient Instructions (Addendum)
Insomnio Insomnia El insomnio es un trastorno del sueo que causa dificultades para conciliar el sueo o para [place]. Puede producir fatiga, falta de energa, dificultad para concentrarse, cambios en el estado de nimo y mal rendimiento escolar olaboral. Hay tres formas diferentes de clasificar el insomnio: Dificultad para conciliar el sueo. Dificultad para mantener el sueo. Despertar muy precoz por la maana. Cualquier tipo de insomnio puede ser a Barrister's clerk (crnico) o a Control and instrumentation engineer (agudo). Ambos son frecuentes. Generalmente, el insomnio a corto plazo dura tres meses o menos tiempo. El crnico ocurre al menos tres veces por semana durantems de tres meses. Cules son las causas? El insomnio puede deberse a otra afeccin, situacin o sustancia, por ejemplo: Ansiedad. Ciertos medicamentos. Enfermedad de reflujo gastroesofgico (ERGE) u otras enfermedades gastrointestinales. Asma y otras enfermedades respiratorias. Sndrome de las piernas inquietas, apnea del sueo u otros trastornos del sueo. Dolor crnico. Menopausia. Accidente cerebrovascular. Consumo excesivo de alcohol, tabaco u drogas ilegales. Afecciones de salud mental, como depresin. Cafena. Trastornos neurolgicos, como enfermedad de Alzheimer. Hiperactividad de la glndula tiroidea (hipertiroidismo). En ocasiones, la causa del insomnio puede ser desconocida. Qu incrementa el riesgo? Los factores de riesgo de tener insomnio incluyen lo siguiente: Sexo. La enfermedad afecta ms a menudo a las mujeres que a los hombres. La edad. El insomnio es ms frecuente a medida que una persona envejece. Estrs. Falta de actividad fsica. Los horarios de trabajo irregulares o los turnos nocturnos. Los viajes a lugares de diferentes zonas horarias. Ciertas afecciones mdicas y de salud mental. Cules son los signos o sntomas? Si tiene insomnio, el sntoma principal es la dificultad para conciliar el sueo o mantenerlo. Esto  puede derivar en otros sntomas, por ejemplo: Sentirse fatigado o tener poca energa. Ponerse nervioso por [employer] irse a dormir. No sentirse descansado por la maana. Tener dificultad para concentrarse. Sentirse irritable, ansioso o deprimido. Cmo se diagnostica? Esta afeccin se puede diagnosticar en funcin de lo siguiente: Los sntomas y los antecedentes mdicos. El mdico puede hacerle preguntas sobre: Hbitos de sueo. Cualquier afeccin mdica que tenga. La salud mental. Un examen fsico. Cmo se trata? El tratamiento para el insomnio depende de la causa. El tratamiento puede centrarse en tratar Ardelia Mems afeccin preexistente que causa el insomnio. El tratamiento tambin puede incluir lo siguiente: Medicamentos que lo ayuden a dormir. Asesoramiento psicolgico o terapia. Ajustes en el estilo de vida para ayudarlo a dormir mejor. Siga estas instrucciones en su casa: Comida y bebida  Limite o evite el consumo de alcohol, bebidas con cafena y cigarrillos, especialmente cerca de la hora de [place], ya que pueden perturbarle el sueo. No consuma una comida suculenta ni coma alimentos condimentados justo antes de la hora de [place]. Esto puede causarle molestias digestivas y dificultades para dormir.  Hbitos de sueo  Lleve un registro del sueo ya que podra ser de utilidad para que usted y a su mdico puedan determinar qu podra estar causndole insomnio. Escriba los siguientes datos: Cundo duerme. Cundo se despierta durante la noche. Qu tan bien duerme. Qu tan relajado se siente al da siguiente. Cualquier efecto secundario de los medicamentos que toma. Lo que usted come y bebe. Convierta su habitacin en un lugar oscuro, cmodo donde sea fcil conciliar el sueo. Coloque persianas o cortinas oscuras que impidan la entrada de la luz del exterior. Para bloquear los ruidos, use un aparato que reproduzca sonidos ambientales o relajantes de fondo. Mantenga baja la  temperatura. Limite el uso de pantallas antes de la hora de  acostarse. Esto incluye: Watching TV. Usar el telfono inteligente, la tableta o la computadora. Siga una rutina que incluya ir a dormir y Chiropodist a la misma hora cada da y noche. Esto puede ayudarlo a conciliar el sueo ms rpidamente. Considere realizar [employer] tranquila, como leer, e incorporarla como parte de la rutina a la hora de irse a dormir. Trate de evitar tomar siestas durante el da para que pueda dormir mejor por la noche. Levntese de la cama si sigue despierto despus de 15 minutos de haber intentado dormirse. Mantenga bajas las luces, pero intente leer o hacer una actividad tranquila. Cuando tenga sueo, regrese a Pharmacist, hospital.  Instrucciones generales Use los medicamentos de venta libre y los recetados solamente como se lo haya indicado el mdico. Realice ejercicio con regularidad como se lo haya indicado el mdico. Evite la actividad fsica desde varias horas antes de irse a dormir. Utilice tcnicas de relajacin para controlar el estrs. Pdale al mdico que le sugiera algunas tcnicas que sean adecuadas para usted. Pueden incluir: Ejercicios de respiracin. Rutinas para aliviar la tensin muscular. Visualizacin de escenas apacibles. Conduzca con cuidado. No conduzca si est muy somnoliento. Concurra a todas las visitas de [address] se lo haya indicado el mdico. Esto es importante. Comunquese con un mdico si: Est cansado durante todo Medical laboratory scientific officer. Tiene dificultad en su rutina diaria debido a la somnolencia. Sigue teniendo problemas para dormir o Teacher, English as a foreign language. Solicite ayuda de inmediato si: Piensa seriamente en lastimarse a usted mismo o a Engineer, maintenance (IT). Si alguna vez siente que puede lastimarse o Physicist, medical a Economist, o tiene pensamientos de poner fin a su vida, busque ayuda de inmediato. Puede dirigirse al servicio de emergencias ms cercano o comunicarse con: Servicio de emergencias de su  localidad (911 en EE. UU.). Una lnea de asistencia al suicida y atencin en crisis como National Suicide Prevention Lifeline (Lnea Nacional de Prevencin del Suicidio), al [phone]. Est disponible las 24 horas del da. Resumen El insomnio es un trastorno del sueo que causa dificultades para conciliar el sueo o para [place]. El insomnio puede ser a Air cabin crew (crnico) o a Product manager (agudo). El tratamiento para el insomnio depende de la causa. El tratamiento puede centrarse en tratar Neomia Dear afeccin preexistente que causa el insomnio. Lleve un registro del sueo ya que podra ser de utilidad para que usted y a su mdico puedan determinar qu podra estar causndole insomnio. Esta informacin no tiene Theme park manager el consejo del mdico. Asegresede hacerle al mdico cualquier pregunta que tenga. Document Revised: 12/27/2019 Document Reviewed: 12/27/2019 Elsevier Patient Education  2022 Elsevier Inc. Hipertensin en los adultos Hypertension, Adult El trmino hipertensin es otra forma de denominar a la presin arterial elevada. La presin arterial elevada fuerza al corazn a trabajar ms parabombear la sangre. Esto puede causar problemas con el paso del [place]. Una lectura de presin arterial est compuesta por 2 nmeros. Hay un nmero superior (sistlico) sobre un nmero inferior (diastlico). Lo ideal es tener la presin arterial por debajo de 120/80. Las elecciones saludables pueden ayudar a Personal assistant presin arterial, o tal vez necesitemedicamentos para bajarla. Cules son las causas? Se desconoce la causa de esta afeccin. Algunas afecciones pueden estarrelacionadas con la presin arterial alta. Qu incrementa el riesgo? Fumar. Tener diabetes mellitus tipo 2, colesterol alto, o ambos. No hacer la cantidad suficiente de actividad fsica o ejercicio. Tener sobrepeso. Consumir mucha grasa, azcar, caloras o sal (sodio) en su dieta. Beber alcohol en exceso. Winferd Humphrey  enfermedad renal a largo plazo (crnica). Tener antecedentes familiares de presin arterial alta. Edad. Los riesgos aumentan con la edad. Raza. El riesgo es mayor para las Statistician. Sexo. Antes de los 45 aos, los hombres corren ms [employer]. Despus de los 65 aos, las mujeres corren ms [employer]. Tener apnea obstructiva del sueo. Estrs. Cules son los signos o los sntomas? Es posible que la presin arterial alta puede no cause sntomas. La presin arterial muy alta (crisis hipertensiva) puede provocar: Dolor de cabeza. Sensaciones de preocupacin o nerviosismo (ansiedad). Falta de aire. Hemorragia nasal. Sensacin de malestar en el estmago (nuseas). Vmitos. Cambios en la forma de ver. Dolor muy intenso en el pecho. Convulsiones. Cmo se trata? Esta afeccin se trata haciendo cambios saludables en el estilo de vida, por ejemplo: Consumir alimentos saludables. Hacer ms ejercicio. Beber menos alcohol. El mdico puede recetarle medicamentos si los cambios en el estilo de vida no son suficientes para Museum/gallery curator la presin arterial y si: El nmero de arriba est por encima de 130. El nmero de abajo est por encima de 80. Su presin arterial personal ideal puede variar. Siga estas instrucciones en su casa: Comida y bebida  Si se lo dicen, siga el plan de alimentacin de DASH (Dietary Approaches to Stop Hypertension, Maneras de alimentarse para detener la hipertensin). Para seguir este plan: Llene la mitad del plato de cada comida con frutas y verduras. Llene un cuarto del plato de cada comida con cereales integrales. Los cereales integrales incluyen pasta integral, arroz integral y pan integral. Coma y beba productos lcteos con bajo contenido de grasa, como leche descremada o yogur bajo en grasas. Llene un cuarto del plato de cada comida con protenas bajas en grasa (magras). Las protenas bajas en grasa incluyen pescado, pollo  sin piel, huevos, frijoles y tofu. Evite consumir carne grasa, carne curada y procesada, o pollo con piel. Evite consumir alimentos prehechos o procesados. Consuma menos de 1500 mg de sal por da. No beba alcohol si: El mdico le indica que no lo haga. Est embarazada, puede estar embarazada o est tratando de [place]. Si bebe alcohol: Limite la cantidad que bebe a lo siguiente: De 0 a 1 medida por da para las mujeres. De 0 a 2 medidas por da para los hombres. Est atento a la cantidad de alcohol que hay en las bebidas que toma. En los [address], una medida equivale a una botella de cerveza de 12 oz (355 ml), un vaso de vino de 5 oz (148 ml) o un vaso de una bebida alcohlica de alta graduacin de 1 oz (44 ml).  Estilo de vida  Trabaje con su mdico para mantenerse en un peso saludable o para perder peso. Pregntele a su mdico cul es el peso recomendable para usted. Haga al menos 30 minutos de ejercicio la [employer]V de la [place]. Estos pueden incluir caminar, nadar o andar en bicicleta. Realice al menos 30 minutos de ejercicio que fortalezca sus msculos (ejercicios de resistencia) al menos 3 das a la [place]. Estos pueden incluir levantar pesas o hacer [place]. No consuma ningn producto que contenga nicotina o tabaco, como cigarrillos, cigarrillos electrnicos y tabaco de Theatre manager. Si necesita ayuda para dejar de fumar, consulte al [employer]. Controle su presin arterial en su casa tal como le indic el mdico. Concurra a todas las visitas de seguimiento como se lo haya indicado el mdico. Esto es importante.  Medicamentos [employer] de venta  libre y los recetados solamente como se lo haya indicado el mdico. Siga cuidadosamente las indicaciones. No omita las dosis de medicamentos para la presin arterial. Los medicamentos pierden eficacia si omite dosis. El hecho de omitir las dosis tambin Lesothoaumenta el riesgo de otros problemas. Pregntele a su mdico a  qu efectos secundarios o reacciones a los Museum/gallery curatormedicamentos debe prestar atencin. Comunquese con un mdico si: Piensa que tiene [place] reaccin a los medicamentos que est tomando. Tiene dolores de cabeza frecuentes (recurrentes). Se siente mareado. Tiene hinchazn en los tobillos. Tiene problemas de visin. Solicite ayuda inmediatamente si: Siente un dolor de cabeza muy intenso. Empieza a sentirse desorientado (confundido). Se siente dbil o adormecido. Siente que va a desmayarse. Tiene un dolor muy intenso en las siguientes zonas: Pecho. Vientre (abdomen). Vomita ms de una vez. Tiene dificultad para respirar. Resumen El trmino hipertensin es otra forma de denominar a la presin arterial elevada. La presin arterial elevada fuerza al corazn a trabajar ms para bombear la sangre. Para la Franklin Resourcesmayora de las personas, una presin arterial normal es menor que 120/80. Las decisiones saludables pueden ayudarle a disminuir su presin arterial. Si no puede bajar su presin arterial mediante decisiones saludables, es posible que deba tomar medicamentos. Esta informacin no tiene Theme park managercomo fin reemplazar el consejo del mdico. Asegresede hacerle al mdico cualquier pregunta que tenga. Document Revised: 11/27/2017 Document Reviewed: 11/27/2017 Elsevier Patient Education  2022 Elsevier Inc. Diabetes mellitus y nutricin, en adultos Diabetes Mellitus and Nutrition, Adult Si sufre de diabetes, o diabetes mellitus, es muy importante tener hbitos alimenticios saludables debido a que sus niveles de Psychologist, counsellingazcar en la sangre (glucosa) se ven afectados en gran medida por lo que come y bebe. [place] alimentos saludables en las cantidades correctas, aproximadamente a la misma hora todos los [place] ayudar a: Scientist, physiologicalControlar la glucemia. Disminuir el riesgo de sufrir una enfermedad cardaca. Mejorar la presin arterial. BaristaAlcanzar o mantener un peso saludable. Qu puede afectar mi plan de alimentacin? Todas las personas  que sufren de diabetes son diferentes y cada una tiene necesidades diferentes en cuanto a un plan de alimentacin. El mdico puede recomendarle que trabaje con un nutricionista para elaborar el mejor plan para usted. Su plan de alimentacin puede variar segn factores como: Las caloras que necesita. Los medicamentos que toma. Su peso. Sus niveles de glucemia, presin arterial y colesterol. Su nivel de [place] the Grenadinesactividad. Otras afecciones que tenga, como enfermedades cardacas o renales. Cmo me afectan los carbohidratos? Los carbohidratos, o hidratos de carbono, afectan su nivel de glucemia ms que cualquier otro tipo de alimento. La ingesta de carbohidratos naturalmente aumenta la cantidad de CarMaxglucosa en la sangre. El recuento de carbohidratos es un mtodo destinado a Midwifellevar un registro de la cantidad de carbohidratos que se consumen. El recuento de carbohidratos es importante para Pharmacologistmantener la glucemia a un nivel saludable, especialmente si utiliza insulina o toma determinadosmedicamentos por va oral para la diabetes. Es importante conocer la cantidad de carbohidratos que se pueden ingerir en cada comida sin correr Surveyor, mineralsningn riesgo. Esto es Government social research officerdiferente en cada persona. Su nutricionista puede ayudarlo a calcular la cantidad de carbohidratos que debeingerir en cada comida y en cada refrigerio. Cmo me afecta el alcohol? El alcohol puede provocar disminuciones sbitas de la glucemia (hipoglucemia), especialmente si utiliza insulina o toma determinados medicamentos por va oral para la diabetes. La hipoglucemia es una afeccin potencialmente mortal. Los sntomas de la hipoglucemia, como somnolencia, mareos y confusin, son similares a los sntomas de haber consumido demasiado alcohol. No  beba alcohol si: Su mdico le indica no hacerlo. Est embarazada, puede estar embarazada o est tratando de [place]. Si bebe alcohol: No beba con el estmago vaco. Limite la cantidad que bebe: De 0 a 1 medida por da  para las mujeres. De 0 a 2 medidas por da para los hombres. Est atento a la cantidad de alcohol que hay en las bebidas que toma. En los [address], una medida equivale a una botella de cerveza de 12 oz (355 ml), un vaso de vino de 5 oz (148 ml) o un vaso de una bebida alcohlica de alta graduacin de 1 oz (44 ml). Mantngase hidratado bebiendo agua, refrescos dietticos o t helado sin azcar. Tenga en cuenta que los refrescos comunes, los jugos y otras bebida para Engineer, manufacturing pueden contener mucha azcar y se deben contar como carbohidratos. Consejos para seguir Social worker las etiquetas de los alimentos Comience por leer el tamao de la porcin en la "Informacin nutricional" en las etiquetas de los alimentos envasados y las bebidas. La cantidad de caloras, carbohidratos, grasas y otros nutrientes mencionados en la etiqueta se basan en una porcin del alimento. Muchos alimentos contienen ms de una porcin por envase. Verifique la cantidad total de gramos (g) de carbohidratos totales en una porcin. Puede calcular la cantidad de porciones de carbohidratos al dividir el total de carbohidratos por 15. Por ejemplo, si un alimento tiene un total de 30 g de carbohidratos totales por porcin, equivale a 2 porciones de carbohidratos. Verifique la cantidad de gramos (g) de grasas saturadas y grasas trans de una porcin. Escoja alimentos que no contengan estas grasas o que su contenido de estas sea [place]. Verifique la cantidad de miligramos (mg) de sal (sodio) en una porcin. La [employer] de las personas deben limitar la ingesta de sodio total a menos de 2300 mg Google. Siempre consulte la informacin nutricional de los alimentos etiquetados como "con bajo contenido de grasa" o "sin grasa". Estos alimentos pueden tener un mayor contenido de International aid/development worker agregada o carbohidratos refinados, y deben evitarse. Hable con su nutricionista para identificar sus objetivos diarios en cuanto a los nutrientes mencionados  en la etiqueta. Al ir de compras Evite comprar alimentos procesados, enlatados o precocidos. Estos alimentos tienden a Counselling psychologist mayor cantidad de [place], sodio y azcar agregada. Compre en la zona exterior de la tienda de comestibles. Esta es la zona donde se encuentran con mayor frecuencia las frutas y las verduras frescas, los cereales a granel, las carnes frescas y los productos lcteos frescos. Al cocinar Utilice mtodos de coccin a baja temperatura, como hornear, en lugar de mtodos de coccin a alta temperatura, como frer en abundante aceite. Cocine con aceites saludables, como el aceite de [place], canola o [place]. Evite cocinar con manteca, crema o carnes con alto contenido de grasa. Planificacin de las comidas Coma las comidas y los refrigerios regularmente, preferentemente a la misma hora todos [place]. Evite pasar largos perodos de tiempo sin comer. Consuma alimentos ricos en fibra, como frutas frescas, verduras, frijoles y cereales integrales. Consulte a su nutricionista sobre cuntas porciones de carbohidratos puede consumir en cada comida. Consuma entre 4 y 6 onzas (entre 112 y 168 g) de protenas magras por da, como carnes [place], pollo, pescado, huevos o tofu. Una onza (oz) de protena magra equivale a: 1 onza (28 g) de carne, pollo o pescado. 1 huevo.  de taza (62 g) de tofu. Coma algunos alimentos por da que contengan grasas saludables, como [place],  frutos secos, semillas y pescado. Qu alimentos debo comer? Nils Pyle Bayas. Manzanas. Naranjas. Duraznos. Damascos. Ciruelas. Uvas. Mango. Papaya.Granada. Kiwi. Cerezas. Hoover Brunette Deatra James. Espinaca. Verduras de [employer], que incluyen col rizada, [place], hojas de [place] y de [place]. Remolachas. Coliflor. Repollo. Brcoli. Zanahorias. Judas verdes. Tomates. Pimientos. Cebollas. Pepinos. Coles deBruselas. Granos Granos integrales, como panes, galletas, tortillas, cereales y pastas desalvado o integrales. Avena sin azcar.  Quinua. Arroz integral o salvaje. Carnes y Warehouse manager. Carne de ave sin piel. Cortes magros de ave y carne de res. Tofu.Frutos secos. Semillas. Lcteos Productos lcteos sin grasa o con bajo contenido de [place], [place], yogur [place]. Es posible que los productos que se enumeran ms [place] no constituyan una lista completa de los alimentos y las bebidas que puede tomar. Consulte a un nutricionista para obtener ms informacin. Qu alimentos debo evitar? Nils Pyle Frutas enlatadas al almbar. Verduras Verduras enlatadas. Verduras congeladas con mantequilla o salsa de crema. Granos Productos elaborados con [place] y [place], como panes, pastas,bocadillos y cereales. Evite todos los alimentos procesados. Carnes y [address] de carne con alto contenido de Holiday representative. Carne de ave con piel. Carnesempanizadas o fritas. Carne procesada. Evite las grasas saturadas. Lcteos Yogur, queso o [employer] Health. Bebidas Bebidas azucaradas, como gaseosas o t helado. Es posible que los productos que se enumeran ms [place] no constituyan una lista completa de los alimentos y las bebidas que Personnel officer. Consulte a un nutricionista para obtener ms informacin. Preguntas para hacerle al mdico Es necesario que me rena con IT trainer en el cuidado de la diabetes? Es necesario que me rena con un nutricionista? A qu nmero puedo llamar si tengo preguntas? Cules son los mejores momentos para controlar la glucemia? Dnde encontrar ms informacin: Asociacin Estadounidense de la Diabetes (American Diabetes Association): diabetes.org Academy of Nutrition and Dietetics (Academia de Nutricin y Pension scheme manager): www.eatright.Dana Corporation of Diabetes and Digestive and Kidney Diseases [employer] & [employer] de la Diabetes y las Enfermedades Digestivas y Renales): CarFlippers.tn Association of Diabetes Care and Education Specialists (Asociacin de Especialistas en  Atencin y Educacin sobre la Diabetes): www.diabeteseducator.org Resumen Es importante tener hbitos alimenticios saludables debido a que sus niveles de Psychologist, counselling sangre (glucosa) se ven afectados en gran medida por lo que come y bebe. Un plan de alimentacin saludable lo ayudar a controlar la glucemia y Pharmacologist un estilo de vida saludable. El mdico puede recomendarle que trabaje con un nutricionista para elaborar el mejor plan para usted. Tenga en cuenta que los carbohidratos (hidratos de carbono) y el alcohol tienen efectos inmediatos en sus niveles de glucemia. Es importante contar los carbohidratos que ingiere y consumir alcohol con prudencia. Esta informacin no tiene Theme park manager el consejo del mdico. Asegresede hacerle al mdico cualquier pregunta que tenga. Document Revised: 03/18/2019 Document Reviewed: 03/18/2019 Elsevier Patient Education  2021 Elsevier Inc. Colesterol elevado High Cholesterol  El colesterol elevado es una afeccin que se caracteriza porque la sangre tiene niveles altos de una sustancia [place], cerosa y parecida a la grasa (colesterol). El hgado fabrica todo el colesterol que el organismo necesita. El organismo humano necesita pequeas cantidades de colesterol para formar las clulas. Elexceso de colesterol se obtiene de los alimentos que se consumen. La sangre transporta el colesterol desde el hgado al resto del cuerpo. Si tiene el colesterol elevado, pueden acumularse depsitos (placas) en las paredes de las arterias. Las arterias son los vasos sanguneos que transportan la sangre desde el corazn al resto del cuerpo. Las placas causanque  las arterias se Armed forces training and education officer y se endurezcan. Las placas de colesterol aumentan el riesgo de sufrir un infarto de miocardio y un accidente cerebrovascular. Trabaje con el mdico para English as a second language teacher de colesterol en un rango saludable. Qu incrementa el riesgo? Los siguientes factores pueden hacer que sea  ms propenso a Aeronautical engineer afeccin: Consumir alimentos con alto contenido de grasa animal (grasa saturada) o colesterol. Tener sobrepeso. No hacer suficiente ejercicio fsico. Tener antecedentes familiares de colesterol alto (hipercolesterolemia familiar). Consumir productos con tabaco. Tener diabetes. Cules son los signos o sntomas? Esta afeccin no presenta sntomas. Cmo se diagnostica? Esta afeccin se puede diagnosticar en funcin de los [address] de un anlisis de [place]. Si es mayor de 20 aos, es posible que el mdico le controle el nivel de colesterol cada 4 a 6 aos. Los controles pueden ser ms frecuentes si tiene el colesterol elevado u otros factores de riesgo de enfermedad cardaca. En el anlisis de sangre de [place], se determina lo siguiente: El colesterol "malo", o colesterol LDL. Este es el principal tipo de colesterol que causa enfermedades cardacas. El nivel recomendado es de menos de 100 mg/dl. El colesterol "bueno", o colesterol HDL. El HDL ayuda a proteger contra la enfermedad cardaca porque limpia las arterias y arrastra el LDL al hgado para que lo procese. El nivel recomendado de HDL es de 60 mg/dl o ms. Triglicridos. Estos son grasas que el organismo puede Academic librarian o quemar como fuente de [place]y. El nivel recomendado es de menos de 150 mg/dl. Colesterol total. Mide la cantidad total de colesterol en la sangre, e incluye el colesterol LDL, el colesterol HDL y los triglicridos. El nivel recomendado es de menos de 200 mg/dl. Cmo se trata? El tratamiento de esta afeccin puede incluir: Cambios en la dieta. Es posible que le indiquen que consuma alimentos con ms [place] y menos grasas saturadas o Engineer, mining. Cambios en el estilo de vida. Estos pueden incluir hacer actividad fsica con regularidad, mantener un peso saludable y dejar de consumir productos con tabaco. Medicamentos. Estos se administran cuando los [employer] dieta y en el estilo  de vida no han sido eficaces. Es posible que le receten medicamentos llamados estatinas para bajar sus niveles de colesterol. Siga estas instrucciones en su casa: Comida y bebida  Siga una dieta saludable y [place]. Esta dieta incluye lo siguiente: Porciones diarias de fruta s y verduras frescas, congeladas o enlatadas. Porciones diarias de alimentos integrales con alto contenido de [place]. Alimentos con bajo contenido de grasas saturadas y grasas trans. Estos incluyen carne de ave y pescado sin piel, cortes de carne magros y productos lcteos descremados. Una variedad de pescado, especialmente pescado graso que contenga cidos grasos omega 3. Propngase comer pescado al [employer] veces por semana. Evite los alimentos y las bebidas que tengan Engineer, mining. Use mtodos de coccin saludables, como asar, Software engineer, hervir, hornear, escalfar, cocer al vapor y Actor. No fra los alimentos excepto para saltearlos.  Estilo de vida  Hacer ejercicio con regularidad. Trate de hacer un total de 150 minutos de actividad fsica por semana. Aumente la cantidad de ejercicio fsico que realiza mediante actividades como la jardinera, Gaffer a Advertising account planner o usar las escaleras. No consuma ningn producto que contenga nicotina o tabaco, como cigarrillos, cigarrillos electrnicos y tabaco de Theatre manager. Si necesita ayuda para dejar de fumar, consulte al mdico.  Instrucciones generales Use los medicamentos de venta libre y los recetados solamente como se lo haya indicado el mdico. Concurra a todas las visitas  de seguimiento como se lo haya indicado el mdico. Esto es importante. Dnde buscar ms informacin American Heart Association (Asociacin Estadounidense del Corazn): www.heart.org National Heart, Lung, and Blood Institute ([employer] [place], los Pulmones y Risk manager): [email or web address] Comunquese con un mdico si: Tiene dificultad para alcanzar o mantener una alimentacin sana y un peso  saludable. Est por comenzar un programa de ejercicios. No puede dejar de fumar. Solicite ayuda de inmediato si: Midwife. Tiene dificultad para respirar. Tiene sntomas de un accidente cerebrovascular. "BE FAST" es una manera fcil de recordar los principales signos de advertencia de un accidente cerebrovascular: B - Balance (equilibrio). Los signos son mareos, dificultad repentina para caminar o prdida del equilibrio. E - Eyes (ojos). Los signos son problemas para ver o un cambio repentino en la visin. F - Face (rostro). Los signos son debilidad repentina o adormecimiento del rostro, o el rostro o el prpado que se caen hacia un lado. A - Arms (brazos). Los signos son debilidad o adormecimiento en un brazo. Esto sucede de repente y generalmente en un lado del cuerpo. S - Speech (habla). Los signos son dificultad para hablar, hablar arrastrando las palabras o dificultad para comprender lo que la gente dice. T - Time (tiempo). Es tiempo de llamar al servicio de Sports administrator. Anote la hora a la que [employer] sntomas. Presenta otros signos de un accidente cerebrovascular, como los siguientes: Dolor de cabeza sbito e intenso que no tiene causa aparente. Nuseas o vmitos. Convulsiones. Estos sntomas pueden representar un problema grave que constituye Radio broadcast assistant. No espere a ver si los sntomas desaparecen. Solicite atencin mdica de inmediato. Comunquese con el servicio de emergencias de su localidad (911 en los [place]). No conduzca por sus propios medios [employer]. Resumen Las placas de colesterol aumentan el riesgo de sufrir un infarto de miocardio y un accidente cerebrovascular. Trabaje con el mdico para [employer] concentraciones de colesterol en un rango saludable. Siga una dieta saludable y equilibrada, haga ejercicio con regularidad y Shan Levans un peso saludable. No consuma ningn producto que contenga nicotina o tabaco, como cigarrillos,  cigarrillos electrnicos y tabaco de Theatre manager. Obtenga ayuda de inmediato si tiene cualquier sntoma de un accidente cerebrovascular. Esta informacin no tiene Theme park manager el consejo del mdico. Asegresede hacerle al mdico cualquier pregunta que tenga. Document Revised: 03/19/2019 Document Reviewed: 03/19/2019 Elsevier Patient Education  2022 [employer].

## 2020-11-07 ENCOUNTER — Encounter (INDEPENDENT_AMBULATORY_CARE_PROVIDER_SITE_OTHER): Payer: Self-pay | Admitting: Primary Care

## 2020-11-07 ENCOUNTER — Other Ambulatory Visit: Payer: Self-pay

## 2020-11-07 ENCOUNTER — Ambulatory Visit (INDEPENDENT_AMBULATORY_CARE_PROVIDER_SITE_OTHER): Payer: Self-pay | Admitting: Primary Care

## 2020-11-07 VITALS — BP 127/87 | HR 90 | Temp 97.5°F | Ht 66.0 in | Wt 226.2 lb

## 2020-11-07 DIAGNOSIS — I10 Essential (primary) hypertension: Secondary | ICD-10-CM

## 2020-11-07 DIAGNOSIS — Z1211 Encounter for screening for malignant neoplasm of colon: Secondary | ICD-10-CM

## 2020-11-07 DIAGNOSIS — I63311 Cerebral infarction due to thrombosis of right middle cerebral artery: Secondary | ICD-10-CM

## 2020-11-07 DIAGNOSIS — E6609 Other obesity due to excess calories: Secondary | ICD-10-CM

## 2020-11-07 DIAGNOSIS — Z6837 Body mass index (BMI) 37.0-37.9, adult: Secondary | ICD-10-CM

## 2020-11-07 DIAGNOSIS — G44219 Episodic tension-type headache, not intractable: Secondary | ICD-10-CM

## 2020-11-07 MED ORDER — AMLODIPINE BESYLATE 10 MG PO TABS: 10.0000 mg | ORAL_TABLET | Freq: Every day | ORAL | 0 refills | Status: DC

## 2020-11-07 NOTE — Patient Instructions (Signed)
Recuento de caloras para bajar de peso Calorie Counting for Weight Loss Las caloras son unidades de energa. El cuerpo necesita una cierta cantidad de caloras de los alimentos para que lo ayuden a funcionar durante todo el da. Cuando se comen o beben ms caloras de las que el cuerpo necesita, este acumula las caloras adicionales mayormente como grasa. Cuando se comen o beben menos caloras de las que el cuerpo necesita, este quema grasa para obtener laenerga que necesita. El recuento de caloras es el registro de la cantidad de caloras que se comen y beben cada da. El recuento de caloras puede ser de ayuda si necesita perder peso. Si come menos caloras de las que el cuerpo necesita, debera bajar depeso. Pregntele al mdico cul es un peso sano para usted. Para que el recuento de caloras funcione, usted tendr que ingerir la cantidad de caloras adecuadas cada da, para bajar una cantidad de peso saludable por semana. Un nutricionista puede ayudar a determinar la cantidad de caloras que usted necesita por da y sugerirle formas de alcanzar su objetivo calrico. Una cantidad de peso saludable para bajar cada semana suele ser entre 1 y 2 libras (0.5 a 0.9 kg). Esto habitualmente significa que su ingesta diaria de caloras se debera reducir en unas 500 a 750 caloras. Ingerir de 1200 a 1500 caloras por da puede ayudar a la mayora de las mujeres a bajar de peso. Ingerir de 1500 a 1800 caloras por da puede ayudar a la mayora de los hombres a bajar de peso. Qu debo saber acerca del recuento de caloras? Trabaje con el mdico o el nutricionista para determinar cuntas caloras debe recibir cada da. A fin de alcanzar su objetivo diario de caloras, tendr que: Averiguar cuntas caloras hay en cada alimento que le gustara comer. Intente hacerlo antes de comer. Decidir la cantidad que puede comer del alimento. Llevar un registro de los alimentos. Para esto, anote lo que comi y cuntas  caloras tena. Para perder peso con xito, es importante equilibrar el recuento de calorascon un estilo de vida saludable que incluya actividad fsica de forma regular. Dnde encuentro informacin sobre las caloras?  Es posible encontrar la cantidad de caloras que contiene un alimento en la etiqueta de informacin nutricional. Si un alimento no tiene una etiqueta de informacin nutricional, intente buscar las caloras en Internet o pida ayudaal nutricionista. Recuerde que las caloras se calculan por porcin. Si opta por comer ms de una porcin de un alimento, tendr que multiplicar las caloras de una porcin por la cantidad de porciones que planea comer. Por ejemplo, la etiqueta de un envase de pan puede decir que el tamao de una porcin es 1 rodaja, y que una porcin tiene 90 caloras. Si come 1 rodaja, habr comido 90 caloras. Si come2 rodajas, habr comido 180 caloras. Cmo llevo un registro de comidas? Despus de cada vez que coma, anote lo siguiente en el registro de alimentos lo antes posible: Lo que comi. Asegrese de incluir los aderezos, las salsas y otros extras en los alimentos. La cantidad que comi. Esto se puede medir en tazas, onzas o cantidad de alimentos. Cuntas caloras haba en cada alimento y en cada bebida. La cantidad total de caloras en la comida que tom. Tenga a mano el registro de alimentos, por ejemplo, en un anotador de bolsillo o utilice una aplicacin o sitio web en el telfono mvil. Algunos programas calcularn las caloras por usted y le mostrarn la cantidad de caloras que lequedan para llegar   al objetivo diario. Cules son algunos consejos para controlar las porciones? Sepa cuntas caloras hay en una porcin. Esto lo ayudar a saber cuntas porciones de un alimento determinado puede comer. Use una taza medidora para medir los tamaos de las porciones. Tambin puede intentar pesar las porciones en una balanza de cocina. Con el tiempo, podr hacer un  clculo estimativo de los tamaos de las porciones de algunos alimentos. Dedique tiempo a poner porciones de diferentes alimentos en sus platos, tazones y tazas predilectos, a fin de saber cmo se ve una porcin. Intente no comer directamente de un envase de alimentos, por ejemplo, de una bolsa o una caja. Comer directamente del envase dificulta ver cunto est comiendo y puede conducir a comer en exceso. Ponga la cantidad que le gustara comer en una taza o un plato, a fin de asegurarse de que est comiendo la porcin correcta. Use platos, vasos y tazones ms pequeos para medir porciones ms pequeas y evitar no comer en exceso. Intente no realizar varias tareas al mismo tiempo. Por ejemplo, evite mirar televisin o usar la computadora mientras come. Si es la hora de comer, sintese a la mesa y disfrute de la comida. Esto lo ayudar a reconocer cundo est satisfecho. Tambin le permitir estar ms consciente de qu come y cunto come. Consejos para seguir este plan Al leer las etiquetas de los alimentos Controle el recuento de caloras en comparacin con el tamao de la porcin. El tamao de la porcin puede ser ms pequeo de lo que suele comer. Verifique la fuente de las caloras. Intente elegir alimentos ricos en protenas, fibras y vitaminas, y bajos en grasas saturadas, grasas trans y sodio. Al ir de compras Lea las etiquetas nutricionales cuando compre. Esto lo ayudar a tomar decisiones saludables sobre qu alimentos comprar. Preste atencin a las etiquetas nutricionales de alimentos bajos en grasas o sin grasas. Estos alimentos a veces tienen la misma cantidad de caloras o ms caloras que las versiones ricas en grasas. Con frecuencia, tambin tienen agregados de azcar, almidn o sal, para darles el sabor que fue eliminado con las grasas. Haga una lista de compras con los alimentos que tienen un menor contenido de caloras y resptela. Al cocinar Intente cocinar sus alimentos preferidos de  una manera ms saludable. Por ejemplo, pruebe hornear en vez de frer. Utilice productos lcteos descremados. Planificacin de las comidas Utilice ms frutas y verduras. La mitad de su plato debe ser de frutas y verduras. Incluya protenas magras, como pollo, pavo y pescado. Estilo de vida Cada semana, trate de hacer una de las siguientes cosas: 150 minutos de ejercicio moderado, como caminar. 75 minutos de ejercicio enrgico, como correr. Informacin general Sepa cuntas caloras tienen los alimentos que come con ms frecuencia. Esto le ayudar a contar las caloras ms rpidamente. Encuentre un mtodo para controlar las caloras que funcione para usted. Sea creativo. Pruebe aplicaciones o programas distintos, si llevar un registro de las caloras no funciona para usted. Qu alimentos debo consumir?  Consuma alimentos nutritivos. Es mejor comer un alimento nutritivo, de alto contenido calrico, como un aguacate, que uno con pocos nutrientes, como una bolsa de patatas fritas. Use sus caloras en alimentos y bebidas que lo sacien y no lo dejen con apetito apenas termina de comer. Ejemplos de alimentos que lo sacian son los frutos secos y mantequillas de frutos secos, verduras, protenas magras y alimentos con alto contenido de fibra como los cereales integrales. Los alimentos con alto contenido de fibra son aquellos que   tienen ms de 5 g de fibra por porcin. Preste atencin a las caloras en las bebidas. Las bebidas de bajas caloras incluyen agua y refrescos sin azcar. Es posible que los productos que se enumeran ms arriba no constituyan una lista completa de los alimentos y las bebidas que puede tomar. Consulte a un nutricionista para obtener ms informacin. Qu alimentos debo limitar? Limite el consumo de alimentos o bebidas que no sean buenas fuentes de vitaminas, minerales o protenas, o que tengan alto contenido de grasas no saludables. Estos incluyen: Caramelos. Otros  dulces. Refrescos, bebidas con caf especiales, alcohol y jugo. Es posible que los productos que se enumeran ms arriba no constituyan una lista completa de los alimentos y las bebidas que debe evitar. Consulte a un nutricionista para obtener ms informacin. Cmo puedo hacer el recuento de caloras cuando como afuera? Preste atencin a las porciones. A menudo, las porciones son mucho ms grandes al comer afuera. Pruebe con estos consejos para mantener las porciones ms pequeas: Considere la posibilidad de compartir una comida en lugar de tomarla toda usted solo. Si pide su propia comida, coma solo la mitad. Antes de empezar a comer, pida un recipiente y ponga la mitad de la comida en l. Cuando sea posible, considere la posibilidad de pedir porciones ms pequeas del men en lugar de porciones completas. Preste atencin a la eleccin de alimentos y bebidas. Saber la forma en que se cocinan los alimentos y lo que incluye la comida puede ayudarlo a ingerir menos caloras. Si se detallan las caloras en el men, elija las opciones que contengan la menor cantidad. Elija platos que incluyan verduras, frutas, cereales integrales, productos lcteos con bajo contenido de grasa y protenas magras. Opte por los alimentos hervidos, asados, cocidos a la parrilla o al vapor. Evite los alimentos a los que se les ponga mantequilla, que estn empanados o fritos, o que se sirvan con salsa a base de crema. Generalmente, los alimentos que se etiquetan como "crujientes" estn fritos, a menos que se indique lo contrario. Elija el agua, la leche descremada, el t helado sin azcar u otras bebidas que no contengan azcares agregados. Si desea una bebida alcohlica, escoja una opcin con menos caloras, como una copa de vino o una cerveza ligera. Ordene los aderezos, las salsas y los jarabes aparte. Estos son, con frecuencia, de alto contenido en caloras, por lo que debe limitar la cantidad que ingiere. Si desea una  ensalada, elija una de hortalizas y pida carnes a la parrilla. Evite las guarniciones adicionales como el tocino, el queso o los alimentos fritos. Ordene el aderezo aparte o pida aceite de oliva y vinagre o limn para aderezar. Haga un clculo estimativo de la cantidad de porciones que le sirven. Conocer el tamao de las porciones lo ayudar a estar atento a la cantidad de comida que come en los restaurantes. Dnde buscar ms informacin Centers for Disease Control and Prevention (Centros para el Control y la Prevencin de Enfermedades): www.cdc.gov U.S. Department of Agriculture (Departamento de Agricultura de los EE. UU.): myplate.gov Resumen El recuento de caloras es el registro de la cantidad de caloras que se comen y beben cada da. Si come menos caloras de las que el cuerpo necesita, debera bajar de peso. Una cantidad de peso saludable para bajar por semana suele ser entre 1 y 2 libras (0.5 a 0.9 kg). Esto significa, con frecuencia, reducir su ingesta diaria de caloras unas 500 a 750 caloras. Es posible encontrar la cantidad de caloras que   contiene un alimento en la etiqueta de informacin nutricional. Si un alimento no tiene una etiqueta de informacin nutricional, intente buscar las caloras en Internet o pida ayuda al nutricionista. Use platos, vasos y tazones ms pequeos para medir porciones ms pequeas y evitar no comer en exceso. Use sus caloras en alimentos y bebidas que lo sacien y no lo dejen con apetito poco tiempo despus de haber comido. Esta informacin no tiene como fin reemplazar el consejo del mdico. Asegresede hacerle al mdico cualquier pregunta que tenga. Document Revised: 06/07/2019 Document Reviewed: 06/07/2019 Elsevier Patient Education  2022 Elsevier Inc.  

## 2020-11-07 NOTE — Progress Notes (Addendum)
Perry Hughes is a 55 y.o. Hispanic male [name] wife he gave permission to translate )presents for hypertension evaluation, Denies shortness of breath, headaches, chest pain or lower extremity edema, sudden onset, vision changes, unilateral weakness, dizziness, paresthesias   Patient reports adherence with medications.  Dietary habits include: low sodium and carb diet  Exercise habits include: yes- walk Family / Social history: Mother( MI)   Past Medical History:  Diagnosis Date   Diabetes mellitus without complication ([place])    Hypertension    No past surgical history on file. No Known Allergies Current Outpatient Medications on File Prior to Visit  Medication Sig Dispense Refill   amLODipine (NORVASC) 10 MG tablet Take 1 tablet (10 mg total) by mouth daily. 90 tablet 0   aspirin 81 MG EC tablet Take 1 tablet (81 mg total) by mouth daily. Swallow whole. 30 tablet 2   atorvastatin (LIPITOR) 80 MG tablet Take 1 tablet (80 mg total) by mouth every evening. 90 tablet 1   clopidogrel (PLAVIX) 75 MG tablet Take 1 tablet (75 mg total) by mouth daily. 90 tablet 0   hydrochlorothiazide (HYDRODIURIL) 25 MG tablet Take 1 tablet (25 mg total) by mouth daily. 90 tablet 3   lisinopril (ZESTRIL) 20 MG tablet Take 1 tablet (20 mg total) by mouth every morning. 90 tablet 2   metFORMIN (GLUCOPHAGE) 500 MG tablet Take 1 tablet (500 mg total) by mouth 2 (two) times daily with a meal. 180 tablet 2   No current facility-administered medications on file prior to visit.   Social History   Socioeconomic History   Marital status: Married    Spouse name: Not on file   Number of children: Not on file   Years of education: Not on file   Highest education level: Not on file  Occupational History   Not on file  Tobacco Use   Smoking status: Every Day   Smokeless tobacco: Never  Substance and Sexual Activity   Alcohol use: Yes   Drug use: No   Sexual activity:  Not on file  Other Topics Concern   Not on file  Social History Narrative   Not on file   Social Determinants of Health   Financial Resource Strain: Not on file  Food Insecurity: Not on file  Transportation Needs: Not on file  Physical Activity: Not on file  Stress: Not on file  Social Connections: Not on file  Intimate Partner Violence: Not on file   No family history on file.   OBJECTIVE: BP 127/87 (BP Location: Right Arm, Patient Position: Sitting, Cuff Size: Large)   Pulse 90   Temp (!) 97.5 F (36.4 C) (Temporal)   Ht $R'5\' 6"'Vn$  (1.676 m)   Wt 226 lb 3.2 oz (102.6 kg)   SpO2 93%   BMI 36.51 kg/m    Physical Exam General: No apparent distress. Eyes: Extraocular eye movements intact, pupils equal and round.(Bilateral ears cerumen present) Neck: Supple, trachea midline. Thyroid: No enlargement, mobile without fixation, no tenderness. Cardiovascular: Regular rhythm and rate, no murmur, normal radial pulses. Respiratory: Normal respiratory effort, clear to auscultation. Gastrointestinal: Normal pitch active bowel sounds, nontender abdomen without distention or appreciable hepatomegaly. Neurologic:  deep tendon reflexes +, no tremor,  Skin: Appropriate warmth, no visible rash. Mental status: Alert, conversant, speech clear, thought logical, appropriate mood and affect, no hallucinations or delusions evident. Hematologic/lymphatic: No cervical adenopathy, no visible ecchymoses.   Review of Systems  Neurological:  Positive for headaches.  Right temporal area feels like electricity   All other systems reviewed and are negative.  Last 3 Office BP readings: BP Readings from Last 3 Encounters:  09/26/20 (!) 170/90  08/29/20 (!) 144/94  07/25/18 (!) 156/89    BMET    Component Value Date/Time   NA 137 08/28/2020 0037   K 3.3 (L) 08/28/2020 0037   CL 105 08/28/2020 0037   CO2 25 08/28/2020 0037   GLUCOSE 135 (H) 08/28/2020 0037   BUN 14 08/28/2020 0037    CREATININE 0.87 08/28/2020 0037   CALCIUM 8.9 08/28/2020 0037   GFRNONAA >60 08/28/2020 0037   GFRAA >60 07/25/2018 0454    Renal function: CrCl cannot be calculated (Patient's most recent lab result is older than the maximum 21 days allowed.).  Clinical ASCVD: No  The ASCVD Risk score (Arnett DK, et al., 2019) failed to calculate for the following reasons:   The patient has a prior MI or stroke diagnosis  ASCVD risk factors include- [place]   ASSESSMENT & PLAN:  Jayten was seen today for blood pressure check.  Diagnoses and all orders for this visit:  Essential hypertension  -Counseled on lifestyle modifications for blood pressure control including reduced dietary sodium, increased exercise, weight reduction and adequate sleep. Also, educated patient about the risk for cardiovascular events, stroke and heart attack. Also counseled patient about the importance of medication adherence. If you participate in smoking, it is important to stop using tobacco as this will increase the risks associated with uncontrolled blood pressure.   -Hypertension longstanding diagnosed currently amlodipine 10mg , lisinopril 20mg  and  on current medications. Patient is adherent with current medications.   Goal BP:  For patients younger than 60: Goal BP < 130/80. For patients 60 and older: Goal BP < 140/90. For patients with diabetes: Goal BP < 130/80. Your most recent BP: 127/87  Minimize salt intake. Minimize alcohol intake  -     amLODipine (NORVASC) 10 MG tablet; Take 1 tablet (10 mg total) by mouth daily. -     CMP14+EGFR; Future  Episodic tension-type headache, not intractable Right temporal area not frequent does not take any medication- self resolves   Colon cancer screening -     Fecal occult blood, imunochemical; Future  Cerebrovascular accident (CVA) due to thrombosis of right middle cerebral artery ([place])  Healthy lifestyle diet of fruits vegetables fish nuts whole grains and low  saturated fat . Foods high in cholesterol or liver, fatty meats,cheese, butter avocados, nuts and seeds, chocolate and fried foods. -     Lipid panel; Future  Class 2 obesity due to excess calories without serious comorbidity with body mass index (BMI) of 37.0 to 37.9 in adult Information provided for weight loss. Agreed to loosing 12 lbs by f/u -     Lipid panel; Future -     CBC with Differential/Platelet; Future    This note has been created with Surveyor, quantity. Any transcriptional errors are unintentional.   Kerin Perna, NP 11/07/2020, 1:35 PM

## 2020-12-13 ENCOUNTER — Encounter: Payer: Self-pay | Admitting: Neurology

## 2020-12-13 ENCOUNTER — Inpatient Hospital Stay: Payer: Self-pay | Admitting: Neurology

## 2020-12-27 ENCOUNTER — Other Ambulatory Visit (INDEPENDENT_AMBULATORY_CARE_PROVIDER_SITE_OTHER): Payer: Self-pay | Admitting: Primary Care

## 2020-12-27 DIAGNOSIS — I6310 Cerebral infarction due to embolism of unspecified precerebral artery: Secondary | ICD-10-CM

## 2020-12-27 MED ORDER — CLOPIDOGREL BISULFATE 75 MG PO TABS: 75.0000 mg | ORAL_TABLET | Freq: Every day | ORAL | 0 refills | Status: DC

## 2020-12-27 NOTE — Telephone Encounter (Signed)
Sent to PCP to refill if appropriate.  

## 2021-05-07 ENCOUNTER — Encounter (INDEPENDENT_AMBULATORY_CARE_PROVIDER_SITE_OTHER): Payer: Self-pay | Admitting: Primary Care

## 2021-05-07 ENCOUNTER — Ambulatory Visit (INDEPENDENT_AMBULATORY_CARE_PROVIDER_SITE_OTHER): Payer: Self-pay | Admitting: Primary Care

## 2021-05-07 ENCOUNTER — Other Ambulatory Visit: Payer: Self-pay

## 2021-05-07 VITALS — BP 164/111 | HR 77 | Temp 98.0°F | Ht 66.0 in | Wt 232.0 lb

## 2021-05-07 DIAGNOSIS — Z6837 Body mass index (BMI) 37.0-37.9, adult: Secondary | ICD-10-CM

## 2021-05-07 DIAGNOSIS — E6609 Other obesity due to excess calories: Secondary | ICD-10-CM

## 2021-05-07 DIAGNOSIS — Z1211 Encounter for screening for malignant neoplasm of colon: Secondary | ICD-10-CM

## 2021-05-07 DIAGNOSIS — E119 Type 2 diabetes mellitus without complications: Secondary | ICD-10-CM

## 2021-05-07 DIAGNOSIS — I10 Essential (primary) hypertension: Secondary | ICD-10-CM

## 2021-05-07 LAB — POCT GLYCOSYLATED HEMOGLOBIN (HGB A1C): Hemoglobin A1C: 5.9 % — AB (ref 4.0–5.6)

## 2021-05-07 MED ORDER — AMLODIPINE BESYLATE 10 MG PO TABS: 10.0000 mg | ORAL_TABLET | Freq: Every day | ORAL | 1 refills | Status: DC

## 2021-05-07 MED ORDER — METFORMIN HCL 500 MG PO TABS: 500.0000 mg | ORAL_TABLET | Freq: Two times a day (BID) | ORAL | 2 refills | Status: DC

## 2021-05-07 NOTE — Patient Instructions (Signed)
Recuento de caloras para bajar de peso Calorie Counting for [employer] Loss Las caloras son unidades de Engineer, drilling. El cuerpo necesita una cierta cantidad de caloras de los alimentos para que lo ayuden a funcionar durante todo Medical laboratory scientific officer. Cuando se comen o beben ms caloras de las que el cuerpo [place], este acumula las caloras adicionales mayormente como grasa. Cuando se comen o beben menos caloras de las que el cuerpo [place], este quema grasa para obtener la energa que necesita. El recuento de caloras es el registro de la cantidad de caloras que se comen y Audiological scientist. El recuento de caloras puede ser de ayuda si necesita perder peso. Si come menos caloras de las que el cuerpo necesita, debera bajar de [place]. Pregntele al mdico cul es un peso sano para usted. Para que el recuento de caloras funcione, usted tendr que ingerir la cantidad de caloras adecuadas cada da, para bajar una cantidad de peso saludable por semana. Un nutricionista puede ayudar a determinar la cantidad de caloras que usted necesita por da y sugerirle formas de Barista su objetivo calrico. Neomia Dear cantidad de peso saludable para bajar cada semana suele ser entre 1 y 2 libras (0.5 a 0.9 kg). Esto habitualmente significa que su ingesta diaria de caloras se debera reducir en unas 500 a 750 caloras. Ingerir de 1200 a 1500 caloras por Clinical research associate a la [employer] de las mujeres a Publishing copy de [place]. Ingerir de 1500 a 1800 caloras por Clinical research associate a la [employer] de los hombres a Publishing copy de [place]. Qu debo saber acerca del recuento de caloras? Trabaje con el mdico o el nutricionista para determinar cuntas caloras debe recibir Management consultant. A fin de alcanzar su objetivo diario de caloras, tendr que: Averiguar cuntas caloras hay en cada alimento que le Lobbyist. Intente hacerlo antes de comer. Decidir la cantidad que puede comer del alimento. Llevar un registro de los alimentos. Para esto, anote lo que comi y cuntas  caloras tena. Para perder peso con xito, es importante equilibrar el recuento de caloras con un estilo de vida saludable que incluya actividad fsica de forma regular. Dnde encuentro informacin sobre las caloras? Es posible Veterinary surgeon cantidad de caloras que contiene un alimento en la etiqueta de informacin nutricional. Si un alimento no tiene una etiqueta de informacin nutricional, intente buscar las caloras en Internet o pida ayuda al nutricionista. Recuerde que las caloras se calculan por porcin. Si opta por comer ms de una porcin de un alimento, tendr [employer] las caloras de una porcin por la cantidad de porciones que planea comer. Por ejemplo, la etiqueta de un envase de pan puede decir que el tamao de una porcin es 1 rodaja, y que una porcin tiene 90 caloras. Si come 1 rodaja, habr comido 90 caloras. Si come 2 rodajas, habr comido 180 caloras. Cmo llevo un registro de comidas? Despus de cada vez que coma, anote lo siguiente en el registro de alimentos lo antes posible: Lo que comi. Asegrese de [employer], las salsas y otros extras [employer]. La cantidad que comi. Esto se puede medir en tazas, onzas o cantidad de alimentos. Cuntas caloras haba en cada alimento y en cada bebida. La cantidad total de caloras en la comida que tom. Tenga a Scientific laboratory technician de alimentos, por ejemplo, en un anotador de bolsillo o utilice una aplicacin o sitio web en el telfono mvil. Algunos programas calcularn las caloras por usted y Insurance account manager la cantidad de caloras  que le quedan para llegar al objetivo diario. Cules son algunos consejos para controlar las porciones? Sepa cuntas caloras hay en una porcin. Esto lo ayudar a saber cuntas porciones de un alimento determinado puede comer. Use una taza medidora para medir los tamaos de las porciones. Tambin Hydrographic surveyor las porciones en una balanza de cocina. Con el tiempo, podr hacer  un clculo estimativo de los tamaos de las porciones de algunos alimentos. Dedique tiempo a poner porciones de diferentes alimentos en sus platos, tazones y tazas predilectos, a fin de saber cmo se ve una porcin. Intente no comer directamente de un envase de alimentos, por ejemplo, de una bolsa o una caja. Comer directamente del envase dificulta ver cunto est comiendo y puede conducir a Actuary. Ponga la cantidad [employer] gustara comer en una taza o un plato, a fin de asegurarse de que est comiendo la porcin correcta. Use platos, vasos y tazones ms pequeos para medir porciones ms pequeas y Automotive engineer no comer en exceso. Intente no realizar varias tareas al [employer]. Por ejemplo, evite mirar televisin o usar la [employer] come. Si es la hora de comer, sintese a Museum/gallery conservator y disfrute de Chemical engineer. Esto lo ayudar a Public house manager cundo est satisfecho. Tambin le permitir estar ms consciente de qu come y cunto come. Consejos para seguir Surveyor, minerals Al leer las etiquetas de los alimentos Controle el recuento de caloras en comparacin con el tamao de la porcin. El tamao de la porcin puede ser ms pequeo de lo que suele comer. Verifique la fuente de las caloras. Intente elegir alimentos ricos en protenas, fibras y vitaminas, y bajos en grasas saturadas, grasas trans y [place]. Al ir de compras Lea las etiquetas nutricionales cuando compre. Esto lo ayudar a tomar decisiones saludables sobre qu alimentos comprar. Preste atencin a las etiquetas nutricionales de alimentos bajos en grasas o sin grasas. Estos alimentos a veces tienen la misma cantidad de caloras o ms caloras que las versiones ricas en grasas. Con frecuencia, tambin tienen agregados de azcar, almidn o sal, para darles el sabor que fue eliminado con las grasas. Haga una lista de compras con los alimentos que tienen un menor contenido de caloras y Leisure centre manager. Al cocinar Intente cocinar sus alimentos preferidos  de una manera ms saludable. Por ejemplo, pruebe hornear en vez de frer. Utilice productos lcteos descremados. Planificacin de las comidas Utilice ms frutas y verduras. La mitad de su plato debe ser de frutas y verduras. Incluya protenas magras, como pollo, pavo y [place]. Estilo de [employer], trate de hacer una de las siguientes cosas: 150 minutos de ejercicio moderado, como caminar. 75 minutos de ejercicio enrgico, como correr. Informacin general Sepa cuntas caloras tienen los alimentos que come con ms frecuencia. Esto le ayudar a contar las caloras ms rpidamente. Encuentre un mtodo para controlar las caloras que funcione para usted. Sea creativo. Pruebe aplicaciones o programas distintos, si llevar un registro de las caloras no funciona para usted. Qu alimentos debo consumir?  Consuma alimentos nutritivos. Es mejor comer un alimento nutritivo, de alto contenido calrico, como un aguacate, que uno con pocos nutrientes, como una bolsa de patatas fritas. Use sus caloras en alimentos y bebidas que lo sacien y no lo dejen con apetito apenas termina de comer. Ejemplos de alimentos que lo sacian son los frutos secos y Civil engineer, contracting de frutos secos, verduras, Associate Professor y Forensic scientist con alto contenido de Research scientist (life sciences) como los cereales integrales. Los alimentos con [employer]  de [place] son aquellos que tienen ms de 5 g de fibra por porcin. Preste atencin a las [employer] Brands. Las bebidas de bajas caloras incluyen agua y refrescos sin International aid/development worker. Es posible que los productos que se enumeran ms [place] no constituyan una lista completa de los alimentos y las bebidas que puede tomar. Consulte a un nutricionista para obtener ms informacin. Qu alimentos debo limitar? Limite el consumo de alimentos o bebidas que no sean buenas fuentes de vitaminas, minerales o protenas, o que tengan alto contenido de grasas no saludables. Estos incluyen: Caramelos. Otros  dulces. Refrescos, bebidas con caf especiales, alcohol y [place]. Es posible que los productos que se enumeran ms [place] no constituyan una lista completa de los alimentos y las bebidas que Personnel officer. Consulte a un nutricionista para obtener ms informacin. Cmo puedo hacer el recuento de caloras cuando como afuera? Preste atencin a las porciones. A menudo, las porciones son mucho ms grandes al comer afuera. Pruebe con estos consejos para mantener las porciones ms pequeas: Considere la posibilidad de compartir una comida en lugar de tomarla toda usted solo. Si pide su propia comida, coma solo la mitad. Antes de empezar a comer, pida un recipiente y ponga la mitad de la comida en l. Cuando sea posible, considere la posibilidad de pedir porciones ms pequeas del men en lugar de porciones completas. Preste atencin a Soil scientist de alimentos y bebidas. Saber la forma en que se cocinan los alimentos y lo que incluye la comida puede ayudarlo a ingerir menos caloras. Si se detallan las caloras en el men, elija las opciones que contengan la menor cantidad. Elija platos que incluyan verduras, frutas, cereales integrales, productos lcteos con bajo contenido de grasa y Associate Professor. Opte por los alimentos hervidos, asados, cocidos a la parrilla o al vapor. Evite los alimentos a los que se les ponga mantequilla, que estn empanados o fritos, o que se sirvan con salsa a base de crema. Generalmente, los alimentos que se etiquetan como crujientes estn fritos, a menos que se indique lo contrario. Elija el agua, la [place] t helado sin azcar u otras bebidas que no contengan azcares agregados. Si desea una bebida alcohlica, escoja una opcin con menos caloras, como una copa de vino o una cerveza ligera. Ordene los [employer], las salsas y los jarabes aparte. Estos son, con frecuencia, de alto contenido en caloras, por lo que debe limitar la cantidad que ingiere. Si desea [place], elija una de hortalizas y pida carnes a la parrilla. Evite las guarniciones adicionales como el tocino, el queso o los alimentos fritos. Ordene el aderezo aparte o pida aceite de [place] y vinagre o limn para Emergency planning/management officer. Haga un clculo estimativo de la cantidad de porciones que le sirven. Conocer el tamao de las porciones lo ayudar a Theme park manager atento a la cantidad de comida que come [employer]. Dnde buscar ms informacin Centers for Disease Control and Prevention (Centros para el Control y la Prevencin de Enfermedades): FootballExhibition.com.br U.S. Department of Agriculture (Departamento de Agricultura de los EE. UU.): WrestlingReporter.dk Resumen El recuento de caloras es el registro de la cantidad de caloras que se comen y Audiological scientist. Si come menos caloras de las que el cuerpo necesita, debera bajar de [place]. Una cantidad de peso saludable para bajar por semana suele ser entre 1 y 2 libras (0.5 a 0.9 kg). Esto significa, con frecuencia, reducir su ingesta diaria de caloras unas 500 a 750 caloras. Es posible Clinical research associate  la cantidad de caloras que contiene un alimento en la etiqueta de informacin nutricional. Si un alimento no tiene una etiqueta de informacin nutricional, intente buscar las caloras en Internet o pida ayuda al nutricionista. Use platos, vasos y tazones ms pequeos para medir porciones ms pequeas y Automotive engineerevitar no comer en exceso. Use sus caloras en alimentos y bebidas que lo sacien y no lo dejen con apetito poco tiempo despus de haber comido. Esta informacin no tiene Theme park managercomo fin reemplazar el consejo del mdico. Asegrese de hacerle al mdico cualquier pregunta que tenga. Document Revised: 06/07/2019 Document Reviewed: 06/07/2019 Elsevier Patient Education  2022 [employer]

## 2021-05-07 NOTE — Progress Notes (Signed)
?[place] ? ? ?Mr.Perry Hughes is a 56 y.o. Hispanic  male (wife [name] was given permission to intrepid and be present at visit) presents for hypertension evaluation, Denies shortness of breath, headaches, chest pain or lower extremity edema, sudden onset, vision changes, unilateral weakness, dizziness, paresthesias  ? ?Patient reports adherence with medications. However, has not had amlodipine 50m , only taking lisinopril 241mand HCTZ 25 mg daily. ? ?Dietary habits include: monitor sodium intake  ?Exercise habits include:no  ?Family / Social history: mother CVD ? ? ?Past Medical History:  ?Diagnosis Date  ? Diabetes mellitus without complication ([facility]  ? Hypertension   ? ?No past surgical history on file. ?No Known Allergies ?Current Outpatient Medications on File Prior to Visit  ?Medication Sig Dispense Refill  ? aspirin 81 MG EC tablet Take 1 tablet (81 mg total) by mouth daily. Swallow whole. 30 tablet 2  ? atorvastatin (LIPITOR) 80 MG tablet Take 1 tablet (80 mg total) by mouth every evening. 90 tablet 1  ? clopidogrel (PLAVIX) 75 MG tablet Take 1 tablet (75 mg total) by mouth daily. 90 tablet 0  ? hydrochlorothiazide (HYDRODIURIL) 25 MG tablet Take 1 tablet (25 mg total) by mouth daily. 90 tablet 3  ? lisinopril (ZESTRIL) 20 MG tablet Take 1 tablet (20 mg total) by mouth every morning. 90 tablet 2  ? ?No current facility-administered medications on file prior to visit.  ? ?Social History  ? ?Socioeconomic History  ? Marital status: Married  ?  Spouse name: Not on file  ? Number of children: Not on file  ? Years of education: Not on file  ? Highest education level: Not on file  ?Occupational History  ? Not on file  ?Tobacco Use  ? Smoking status: Former  ?  Types: Cigarettes  ?  Quit date: 09/13/2020  ?  Years since quitting: 0.6  ? Smokeless tobacco: Never  ?Substance and Sexual Activity  ? Alcohol use: Yes  ? Drug use: No  ? Sexual activity: Not on file  ?Other Topics Concern  ? Not  on file  ?Social History Narrative  ? Not on file  ? ?Social Determinants of Health  ? ?Financial Resource Strain: Not on file  ?Food Insecurity: Not on file  ?Transportation Needs: Not on file  ?Physical Activity: Not on file  ?Stress: Not on file  ?Social Connections: Not on file  ?Intimate Partner Violence: Not on file  ? ?No family history on file. ? ? ?OBJECTIVE: ? ?Vitals:  ? 05/07/21 0943 05/07/21 1004  ?BP: (!) 158/100 (!) 164/111  ?Pulse: 76 77  ?Temp: 98 ?F (36.7 ?C)   ?TempSrc: Oral   ?SpO2: 96%   ?Weight: 232 lb (105.2 kg)   ?Height: _0  (1.676 m)   ? ? ?Physical Exam ?Vitals reviewed.  ?Constitutional:   ?   Appearance: He is obese.  ?HENT:  ?   Right Ear: Tympanic membrane and external ear normal.  ?   Left Ear: Tympanic membrane and external ear normal.  ?   Nose: Nose normal.  ?Eyes:  ?   Extraocular Movements: Extraocular movements intact.  ?   Pupils: Pupils are equal, round, and reactive to light.  ?Cardiovascular:  ?   Rate and Rhythm: Normal rate and regular rhythm.  ?Pulmonary:  ?   Effort: Pulmonary effort is normal.  ?   Breath sounds: Normal breath sounds.  ?Abdominal:  ?   General: Bowel sounds are normal. There is distension.  ?  Palpations: Abdomen is soft.  ?Musculoskeletal:     ?   General: Normal range of motion.  ?   Cervical back: Normal range of motion and neck supple.  ?Skin: ?   General: Skin is warm and dry.  ?Neurological:  ?   Mental Status: He is alert and oriented to person, place, and time.  ?Psychiatric:     ?   Mood and Affect: Mood normal.     ?   Behavior: Behavior normal.     ?   Thought Content: Thought content normal.     ?   Judgment: Judgment normal.  ? ? ?ROS ?Comprehensive ROS Pertinent positive and negative noted in HPI   ?Last 3 Office BP readings: ?BP Readings from Last 3 Encounters:  ?05/07/21 (!) 164/111  ?11/07/20 127/87  ?09/26/20 (!) 170/90  ? ? ?BMET ?   ?Component Value Date/Time  ? NA 137 08/28/2020 0037  ? K 3.3 (L) 08/28/2020 0037  ? CL 105  08/28/2020 0037  ? CO2 25 08/28/2020 0037  ? GLUCOSE 135 (H) 08/28/2020 0037  ? BUN 14 08/28/2020 0037  ? CREATININE 0.87 08/28/2020 0037  ? CALCIUM 8.9 08/28/2020 0037  ? GFRNONAA >60 08/28/2020 0037  ? GFRAA >60 07/25/2018 0454  ? ? ?Renal function: ?CrCl cannot be calculated (Patient's most recent lab result is older than the maximum 21 days allowed.). ? ?Clinical ASCVD: No  ?The ASCVD Risk score (Arnett DK, et al., 2019) failed to calculate for the following reasons: ?  The patient has a prior MI or stroke diagnosis ? ?ASCVD risk factors include- [place] ? ? ?ASSESSMENT & PLAN: ?Jervon was seen today for diabetes and blood pressure check. ? ?Diagnoses and all orders for this visit: ? ?Type 2 diabetes mellitus without complication, without long-term current use of insulin ([place]) ?-     HgB A1c 5.9 improved from 6.9 . ADA recommends the following therapeutic goals for glycemic control related to A1c measurements: Goal of therapy:Has been met of Less than 6.5 hemoglobin A1c.  Reference clinical practice recommendations. ?Foods that are high in carbohydrates are the following rice, potatoes, breads, sugars, and pastas.  Reduction in the intake (eating) will assist in lowering your blood sugars.  ?-     metFORMIN (GLUCOPHAGE) 500 MG tablet; Take 1 tablet (500 mg total) by mouth 2 (two) times daily with a meal. ? ? ?Colon cancer screening ?-     Fecal occult blood, imunochemical; Future ? ?Class 2 obesity due to excess calories without serious comorbidity with body mass index (BMI) of 37.0 to 37.9 in adult ?Obesity is 30-39 indicating an excess in caloric intake or underlining conditions. This may lead to other co-morbidities. Lifestyle modifications of diet and exercise may reduce obesity.   ?  ? ?Essential hypertension ?Start taking - with amLODipine (NORVASC) 10 MG tablet; Take 1 tablet (10 mg total) by mouth daily. HCTZ 25 and lisinopril  47m daily ?-Counseled on lifestyle modifications for blood pressure control  including reduced dietary sodium, increased exercise, weight reduction and adequate sleep. Also, educated patient about the risk for cardiovascular events, stroke and heart attack. Also counseled patient about the importance of medication adherence. If you participate in smoking, it is important to stop using tobacco as this will increase the risks associated with uncontrolled blood pressure.  ? ?-Hypertension longstanding diagnosed currently HCTZ 25 and lisinopril  234mdaily on current medications. Patient is adherent with current medications.  ? ?Goal BP:  ?For patients younger than  60: Goal BP < 130/80. ?For patients 60 and older: Goal BP < 140/90. ?For patients with diabetes: Goal BP < 130/80. ?Your most recent BP: 164/111 ? ?Minimize salt intake. ?Minimize alcohol intake ? ? ? ?This note has been created with Surveyor, quantity. Any transcriptional errors are unintentional.  ? ?Kerin Perna, NP ?05/07/2021, 1:55 PM ?  ?

## 2021-06-15 ENCOUNTER — Encounter (INDEPENDENT_AMBULATORY_CARE_PROVIDER_SITE_OTHER): Payer: Self-pay | Admitting: Primary Care

## 2021-06-15 ENCOUNTER — Ambulatory Visit (INDEPENDENT_AMBULATORY_CARE_PROVIDER_SITE_OTHER): Payer: Self-pay | Admitting: Primary Care

## 2021-06-15 VITALS — BP 136/88 | HR 71 | Temp 97.7°F | Ht 66.0 in | Wt 235.8 lb

## 2021-06-15 DIAGNOSIS — E6609 Other obesity due to excess calories: Secondary | ICD-10-CM

## 2021-06-15 DIAGNOSIS — I63311 Cerebral infarction due to thrombosis of right middle cerebral artery: Secondary | ICD-10-CM

## 2021-06-15 DIAGNOSIS — Z1211 Encounter for screening for malignant neoplasm of colon: Secondary | ICD-10-CM

## 2021-06-15 DIAGNOSIS — N521 Erectile dysfunction due to diseases classified elsewhere: Secondary | ICD-10-CM

## 2021-06-15 DIAGNOSIS — Z6837 Body mass index (BMI) 37.0-37.9, adult: Secondary | ICD-10-CM

## 2021-06-15 DIAGNOSIS — I10 Essential (primary) hypertension: Secondary | ICD-10-CM

## 2021-06-15 DIAGNOSIS — E1169 Type 2 diabetes mellitus with other specified complication: Secondary | ICD-10-CM

## 2021-06-15 MED ORDER — TADALAFIL 10 MG PO TABS: 10.0000 mg | ORAL_TABLET | ORAL | 1 refills | Status: DC | PRN

## 2021-06-15 NOTE — Patient Instructions (Signed)
Recuento de calor?as para bajar de peso ?Calorie Counting for Weight Loss ?Las calor?as son unidades de energ?a. El cuerpo necesita una cierta cantidad de calor?as de los alimentos para que lo ayuden a funcionar durante todo el d?a. Cuando se comen o beben m?s calor?as de las que el cuerpo necesita, este acumula las calor?as adicionales mayormente como grasa. Cuando se comen o beben menos calor?as de las que el cuerpo [place], este quema grasa para obtener la energ?a que necesita. ?El recuento de calor?as es Manufacturing engineer de la cantidad de calor?as que se comen y beben cada d?a. El recuento de calor?as puede ser de ayuda si necesita perder peso. Si come menos calor?as de las que el cuerpo [place], deber?a bajar [employer]. Preg?ntele al m?dico cu?l es un peso sano para usted. ?Para que el recuento de calor?as funcione, usted tendr? que ingerir la cantidad de calor?as adecuadas cada d?a, para bajar una cantidad de peso saludable por semana. Un nutricionista puede ayudar a determinar la cantidad de calor?as que usted necesita por d?a y sugerirle formas de Barista su objetivo cal?rico. ?Una cantidad de peso saludable para bajar cada semana suele ser entre 1 y 2 libras (0.5 a 0.9 kg). Esto habitualmente significa que su ingesta diaria de calor?as se deber?a reducir en unas 500 a 750 calor?as. ?Ingerir de 1200 a 1500 calor?as por d?a puede ayudar a la mayor?a de las mujeres a Publishing copy de [place]. ?Ingerir de 1500 a 1800 calor?as por d?a puede ayudar a la mayor?a de los hombres a Publishing copy de [place]. ??Qu? debo saber acerca del recuento de calor?as? ?Trabaje con el m?dico o el nutricionista para determinar cu?ntas calor?as debe recibir cada d?a. A fin de alcanzar su objetivo diario de calor?as, tendr? que: ?Averiguar cu?ntas calor?as hay en cada alimento que le gustar?a comer. Intente hacerlo antes de comer. ?Decidir la cantidad que puede comer del alimento. ?Llevar un registro de los alimentos. Para esto, anote lo que comi? y cu?ntas  calor?as ten?a. ?Para perder peso con ?xito, es importante equilibrar el recuento de calor?as con un estilo de vida saludable que incluya actividad f?sica de forma regular. ??D?nde encuentro informaci?n sobre las calor?as? ? ?Es posible Veterinary surgeon cantidad de calor?as que contiene un alimento en la etiqueta de informaci?n nutricional. Si un alimento no tiene una etiqueta de informaci?n nutricional, intente buscar las calor?as en Internet o pida ayuda al nutricionista. ?Recuerde que las calor?as se calculan por porci?n. Si opta por comer m?s de una porci?n de un alimento, tendr? Programmer, systems?as de una porci?n por la cantidad de porciones que planea comer. Por ejemplo, la etiqueta de un envase de pan puede decir que el tama?o de una porci?n es 1 rodaja, y que una porci?n tiene 90 calor?as. Si come 1 rodaja, habr? comido 90 calor?as. Si come 2 rodajas, habr? comido 180 calor?as. ??C?mo llevo un registro de comidas? ?Despu?s de cada vez que coma, anote lo siguiente en el registro de alimentos lo antes posible: ?Lo que comi?. Aseg?rese de incluir los aderezos, las salsas y otros extras en los alimentos. ?La cantidad que comi?Vivia Budge se puede medir en tazas, onzas o cantidad de alimentos. ?Cu?ntas calor?as hab?a en cada alimento y en cada bebida. ?La cantidad total de calor?as en la comida que tom?Marland Kitchen ?Tenga a Scientific laboratory technician de alimentos, por ejemplo, en un anotador de bolsillo o utilice una aplicaci?n o sitio web en el tel?fono m?vil. Algunos programas calcular?n las calor?as por usted y Therapist, music?n la cantidad de  calor?as que le quedan para llegar al objetivo diario. ??Cu?les son algunos consejos para controlar las porciones? ?Sepa cu?ntas calor?as hay en una porci?n. Esto lo ayudar? a saber cu?ntas porciones de un alimento determinado puede comer. ?Use una taza medidora para medir los tama?os de las porciones. Tambi?n puede intentar pesar las porciones en una balanza de cocina. Con el tiempo, podr? hacer  un c?lculo estimativo de los tama?os de las porciones de [employer]. ?Dedique tiempo a poner porciones de diferentes alimentos en sus platos, tazones y tazas predilectos, a fin de saber c?mo se ve una porci?n. ?Intente no comer directamente de un envase de alimentos, por ejemplo, de una bolsa o una caja. Comer directamente del envase dificulta ver cu?nto est? comiendo y puede conducir a Actuary. Ponga la cantidad [employer] gustar?a comer en una taza o un plato, a fin de asegurarse de que est? comiendo la porci?n correcta. ?Use platos, vasos y tazones m?s peque?os para medir porciones m?s peque?as y Automotive engineer no comer en exceso. ?Intente no realizar varias tareas al [employer]. Por ejemplo, evite mirar televisi?n o usar la [employer] come. Si es la hora de comer, si?ntese a la mesa y disfrute de Chemical engineer. Esto lo ayudar? a Public house manager cu?ndo est? satisfecho. Tambi?n le permitir? estar m?s consciente de qu? come y cu?nto come. ?Consejos para seguir este plan ?Al leer las etiquetas de los alimentos ?Controle el recuento de calor?as en comparaci?n con el tama?o de la porci?n. El tama?o de la porci?n puede ser m?s peque?o de lo que suele comer. ?Verifique la fuente de las calor?as. Intente elegir alimentos ricos en prote?nas, fibras y vitaminas, y bajos en grasas saturadas, grasas trans y [place]. ?Al ir de compras ?Lea las etiquetas nutricionales cuando compre. Esto lo ayudar? a tomar decisiones saludables sobre qu? alimentos comprar. ?Preste atenci?n a las etiquetas nutricionales de alimentos bajos en grasas o sin grasas. Estos alimentos a veces tienen la misma cantidad de calor?as o m?s calor?as que las versiones ricas en grasas. Con frecuencia, tambi?n tienen agregados de az?car, almid?n o sal, para darles el sabor que fue eliminado con las grasas. ?Leanna Sato lista de compras con los alimentos que tienen un menor contenido de calor?as y resp?tela. ?Al cocinar ?Intente cocinar sus alimentos preferidos  de una manera m?s saludable. Por ejemplo, pruebe hornear en vez de fre?r. ?Utilice productos l?cteos descremados. ?Planificaci?n de las comidas ?Utilice m?s frutas y verduras. La mitad de su plato debe ser de frutas y verduras. ?Incluya prote?nas magras, como pollo, pavo y pescado. ?Estilo de vida ?Cada semana, trate de hacer una de las siguientes cosas: ?150 minutos de ejercicio moderado, como caminar. ?75 minutos de ejercicio en?rgico, como correr. ?Informaci?n general ?Sepa cu?ntas calor?as tienen los [employer] come con m?s frecuencia. Esto le ayudar? a contar las calor?as m?s r?pidamente. ?Encuentre un m?todo para controlar las calor?as que funcione para usted. Sea creativo. Pruebe aplicaciones o programas distintos, si llevar un registro de las calor?as no funciona para usted. ??Qu? alimentos debo consumir? ? ?Consuma alimentos nutritivos. Es mejor comer un alimento nutritivo, de alto contenido cal?rico, como un aguacate, que uno con pocos nutrientes, como una bolsa de patatas fritas. ?Use sus calor?as en alimentos y bebidas que lo sacien y no lo dejen con apetito apenas termina de comer. ?Ejemplos de alimentos que lo sacian son los frutos secos y Civil engineer, contracting de frutos secos, verduras, prote?nas magras y Forensic scientist con alto contenido de Research scientist (life sciences) como los cereales integrales. Los alimentos con [employer]  contenido de [place] son aquellos que tienen m?s de 5 g de fibra por porci?n. ?Preste atenci?n a las calor?as en las bebidas. Las bebidas de bajas calor?as incluyen agua y refrescos sin az?car. ?Es posible que los productos que se enumeran m?s arriba no constituyan una lista completa de los alimentos y las bebidas que puede tomar. Consulte a un nutricionista para obtener m?s informaci?n. ??Qu? alimentos debo limitar? ?Limite el consumo de alimentos o bebidas que no sean buenas fuentes de vitaminas, minerales o prote?nas, o que tengan alto contenido de grasas no saludables. Estos incluyen: ?Caramelos. ?Otros  dulces. ?Refrescos, bebidas con caf? especiales, alcohol y [place]. ?Es posible que los productos que se enumeran m?s arriba no constituyan una lista completa de los alimentos y las bebidas que Personnel officerdebe evitar. Consu

## 2021-06-15 NOTE — Progress Notes (Signed)
?[place] ? ?Darryll Raju, is a 56 y.o. male ? ?HFG:[id number] ? ?[id number] ? ?DOB - 20-Oct-1965 ? ?Chief Complaint  ?Patient presents with  ? Hypertension  ? Follow-up  ?    ? ?Subjective:  ? ?Perry Hughes is a 56 y.o. male here today for a follow up visit for management of HTN. Patient has No headache, No chest pain, No abdominal pain - No Nausea, No new weakness tingling or numbness, No Cough - shortness of breath ? ?No problems updated. ? ?No Known Allergies ? ?Past Medical History:  ?Diagnosis Date  ? Diabetes mellitus without complication ([place])   ? Hypertension   ? ? ?Current Outpatient Medications on File Prior to Visit  ?Medication Sig Dispense Refill  ? amLODipine (NORVASC) 10 MG tablet Take 1 tablet (10 mg total) by mouth daily. 90 tablet 1  ? atorvastatin (LIPITOR) 80 MG tablet Take 1 tablet (80 mg total) by mouth every evening. 90 tablet 1  ? hydrochlorothiazide (HYDRODIURIL) 25 MG tablet Take 1 tablet (25 mg total) by mouth daily. 90 tablet 3  ? lisinopril (ZESTRIL) 20 MG tablet Take 1 tablet (20 mg total) by mouth every morning. 90 tablet 2  ? metFORMIN (GLUCOPHAGE) 500 MG tablet Take 1 tablet (500 mg total) by mouth 2 (two) times daily with a meal. 180 tablet 2  ? aspirin 81 MG EC tablet Take 1 tablet (81 mg total) by mouth daily. Swallow whole. 30 tablet 2  ? clopidogrel (PLAVIX) 75 MG tablet Take 1 tablet (75 mg total) by mouth daily. 90 tablet 0  ? ?No current facility-administered medications on file prior to visit.  ? ?Comprehensive ROS Pertinent positive and negative noted in HPI   ?Objective:  ? ?Vitals:  ? 06/15/21 0958  ?BP: 136/88  ?Pulse: 71  ?Temp: 97.7 ?F (36.5 ?C)  ?TempSrc: Oral  ?SpO2: 95%  ?Weight: 235 lb 12.8 oz (107 kg)  ?Height: _0  (1.676 m)  ? ? ?Exam ?General appearance : Awake, alert, not in any distress. Speech Clear. Not toxic looking ?HEENT: Atraumatic and Normocephalic, pupils equally reactive to light and accomodation ?Neck: Supple, no JVD. No  cervical lymphadenopathy.  ?Chest: Good air entry bilaterally, no added sounds  ?CVS: S1 S2 regular, no murmurs.  ?Abdomen: Bowel sounds present, Non tender and not distended with no gaurding, rigidity or rebound. ?Extremities: B/L Lower Ext shows no edema, both legs are warm to touch ?Neurology: Awake alert, and oriented X 3,  Non focal ?Skin: No Rash ? ?Data Review ?Lab Results  ?Component Value Date  ? HGBA1C 5.9 (A) 05/07/2021  ? HGBA1C 6.9 (H) 08/27/2020  ? HGBA1C 6.9 (H) 08/26/2020  ? ? ?Assessment & Plan  ? ?1. Erectile dysfunction associated with type 2 diabetes mellitus (HCC) ?- tadalafil (CIALIS) 10 MG tablet; Take 1 tablet (10 mg total) by mouth every other day as needed for erectile dysfunction.  Dispense: 10 tablet; Refill: 1 ? ?2. Class 2 obesity due to excess calories without serious comorbidity with body mass index (BMI) of 37.0 to 37.9 in adult ?Obesity is 30-39 indicating an excess in caloric intake or underlining conditions. This may lead to other co-morbidities. Lifestyle modifications of diet and exercise may reduce obesity.   ?- CBC with Differential/Platelet ?- Lipid panel ? ?3. Cerebrovascular accident (CVA) due to thrombosis of right middle cerebral artery ([place]) ?- Lipid panel ? ?4. Essential hypertension ?BP goal - < 130/80 ?Explained that having normal blood pressure is the goal and medications are helping  to get to goal and maintain normal blood pressure. ?DIET: Limit salt intake, read nutrition labels to check salt content, limit fried and high fatty foods  ?Avoid using multisymptom OTC cold preparations that generally contain sudafed which can rise BP. Consult with pharmacist on best cold relief products to use for persons with HTN ?EXERCISE ?Discussed incorporating exercise such as walking - 30 minutes most days of the week and can do in 10 minute intervals    ?- CMP14+EGFR ? ? ? ?Patient have been counseled extensively about nutrition and exercise. Other issues discussed during this  visit include: low cholesterol diet, weight control and daily exercise, foot care, annual eye examinations at Ophthalmology, importance of adherence with medications and regular follow-up. We also discussed long term complications of uncontrolled diabetes and hypertension.  ? ?Return in about 3 months (around 09/14/2021) for Bp . ? ?The patient was given clear instructions to go to ER or return to medical center if symptoms don't improve, worsen or new problems develop. The patient verbalized understanding. The patient was told to call to get lab results if they haven't heard anything in the next week.  ? ?This note has been created with Surveyor, quantity. Any transcriptional errors are unintentional.  ? ?Kerin Perna, NP ?06/15/2021, 11:53 AM  ?

## 2021-06-16 LAB — CBC WITH DIFFERENTIAL/PLATELET
Basophils Absolute: 0.1 10*3/uL (ref 0.0–0.2)
Basos: 1 %
EOS (ABSOLUTE): 0.2 10*3/uL (ref 0.0–0.4)
Eos: 4 %
Hematocrit: 45.9 % (ref 37.5–51.0)
Hemoglobin: 15.3 g/dL (ref 13.0–17.7)
Immature Grans (Abs): 0 10*3/uL (ref 0.0–0.1)
Immature Granulocytes: 0 %
Lymphocytes Absolute: 2 10*3/uL (ref 0.7–3.1)
Lymphs: 32 %
MCH: 29.8 pg (ref 26.6–33.0)
MCHC: 33.3 g/dL (ref 31.5–35.7)
MCV: 89 fL (ref 79–97)
Monocytes Absolute: 0.6 10*3/uL (ref 0.1–0.9)
Monocytes: 9 %
Neutrophils Absolute: 3.4 10*3/uL (ref 1.4–7.0)
Neutrophils: 54 %
Platelets: 346 10*3/uL (ref 150–450)
RBC: 5.14 x10E6/uL (ref 4.14–5.80)
RDW: 12.9 % (ref 11.6–15.4)
WBC: 6.3 10*3/uL (ref 3.4–10.8)

## 2021-06-16 LAB — CMP14+EGFR
ALT: 37 IU/L (ref 0–44)
AST: 24 IU/L (ref 0–40)
Albumin/Globulin Ratio: 1.6 (ref 1.2–2.2)
Albumin: 4.6 g/dL (ref 3.8–4.9)
Alkaline Phosphatase: 86 IU/L (ref 44–121)
BUN/Creatinine Ratio: 13 (ref 9–20)
BUN: 11 mg/dL (ref 6–24)
Bilirubin Total: 0.5 mg/dL (ref 0.0–1.2)
CO2: 23 mmol/L (ref 20–29)
Calcium: 9.3 mg/dL (ref 8.7–10.2)
Chloride: 106 mmol/L (ref 96–106)
Creatinine, Ser: 0.83 mg/dL (ref 0.76–1.27)
Globulin, Total: 2.9 g/dL (ref 1.5–4.5)
Glucose: 114 mg/dL — ABNORMAL HIGH (ref 70–99)
Potassium: 4.3 mmol/L (ref 3.5–5.2)
Sodium: 143 mmol/L (ref 134–144)
Total Protein: 7.5 g/dL (ref 6.0–8.5)
eGFR: 103 mL/min/{1.73_m2} (ref >59)

## 2021-06-16 LAB — LIPID PANEL
Chol/HDL Ratio: 5.2 ratio — ABNORMAL HIGH (ref 0.0–5.0)
Cholesterol, Total: 244 mg/dL — ABNORMAL HIGH (ref 100–199)
HDL: 47 mg/dL (ref >39)
LDL Chol Calc (NIH): 182 mg/dL — ABNORMAL HIGH (ref 0–99)
Triglycerides: 84 mg/dL (ref 0–149)
VLDL Cholesterol Cal: 15 mg/dL (ref 5–40)

## 2021-06-18 ENCOUNTER — Other Ambulatory Visit (INDEPENDENT_AMBULATORY_CARE_PROVIDER_SITE_OTHER): Payer: Self-pay | Admitting: Primary Care

## 2021-06-18 DIAGNOSIS — E782 Mixed hyperlipidemia: Secondary | ICD-10-CM

## 2021-06-18 MED ORDER — ATORVASTATIN CALCIUM 80 MG PO TABS: 80.0000 mg | ORAL_TABLET | Freq: Every evening | ORAL | 1 refills | Status: DC

## 2021-06-19 LAB — FECAL OCCULT BLOOD, IMMUNOCHEMICAL: Fecal Occult Bld: NEGATIVE

## 2021-07-30 ENCOUNTER — Ambulatory Visit (INDEPENDENT_AMBULATORY_CARE_PROVIDER_SITE_OTHER): Payer: Self-pay

## 2021-07-30 ENCOUNTER — Other Ambulatory Visit (INDEPENDENT_AMBULATORY_CARE_PROVIDER_SITE_OTHER): Payer: Self-pay | Admitting: Primary Care

## 2021-07-30 DIAGNOSIS — I10 Essential (primary) hypertension: Secondary | ICD-10-CM

## 2021-07-30 MED ORDER — AMLODIPINE BESYLATE 10 MG PO TABS: 10.0000 mg | ORAL_TABLET | Freq: Every day | ORAL | 1 refills | Status: DC

## 2021-07-30 MED ORDER — LISINOPRIL 20 MG PO TABS: 20.0000 mg | ORAL_TABLET | Freq: Every morning | ORAL | 2 refills | Status: DC

## 2021-07-30 MED ORDER — HYDROCHLOROTHIAZIDE 25 MG PO TABS: 25.0000 mg | ORAL_TABLET | Freq: Every day | ORAL | 3 refills | Status: DC

## 2021-07-30 NOTE — Telephone Encounter (Signed)
Please advise 

## 2021-07-30 NOTE — Telephone Encounter (Signed)
Pt has swelling of feet during his last visit and now the swelling has increased / please advise [phone] he speaks spanish      Chief Complaint: Swelling to feet, ankles, knees Symptoms: Swelling Frequency: In April Pertinent Negatives: Patient denies any other symptoms Disposition: [] ED /[] Urgent Care (no appt availability in office) / [] Appointment(In office/virtual)/ []  [facility]/ [] Home Care/ [] Refused Recommended Disposition /[] [facility]/ [x]  Follow-up with PCP Additional Notes: Asking for advice from PCP.  Answer Assessment - Initial Assessment Questions 1. ONSET: "When did the swelling start?" (e.g., minutes, hours, days)     April 2. LOCATION: "What part of the leg is swollen?"  "Are both legs swollen or just one leg?"     Both feet 3. SEVERITY: "How bad is the swelling?" (e.g., localized; mild, moderate, severe)  - Localized - small area of swelling localized to one leg  - MILD pedal edema - swelling limited to foot and ankle, pitting edema < 1/4 inch (6 mm) deep, rest and elevation eliminate most or all swelling  - MODERATE edema - swelling of lower leg to knee, pitting edema > 1/4 inch (6 mm) deep, rest and elevation only partially reduce swelling  - SEVERE edema - swelling extends above knee, facial or hand swelling present      Ankles and above knees 4. REDNESS: "Does the swelling look red or infected?"     No 5. PAIN: "Is the swelling painful to touch?" If Yes, ask: "How painful is it?"   (Scale 1-10; mild, moderate or severe)     No 6. FEVER: "Do you have a fever?" If Yes, ask: "What is it, how was it measured, and when did it start?"      No 7. CAUSE: "What do you think is causing the leg swelling?"     Unsure 8. MEDICAL HISTORY: "Do you have a history of heart failure, kidney disease, liver failure, or cancer?"     No 9. RECURRENT SYMPTOM: "Have you had leg swelling before?" If Yes, ask: "When was the last time?" "What happened that  time?"     No 10. OTHER SYMPTOMS: "Do you have any other symptoms?" (e.g., chest pain, difficulty breathing)       No 11. PREGNANCY: "Is there any chance you are pregnant?" "When was your last menstrual period?"       N/a  Protocols used: Leg Swelling and Edema-A-AH

## 2021-07-30 NOTE — Telephone Encounter (Signed)
Called patient with interpreter Julieanne Cotton [phone]  with concerns of swelling of feet -bilateral swelling for 3 weeks and the swelling is getting worst. No problems walking, no shortness of breath or chest. Able to check Bp at home 163/88. Elevate feet -yes , monitor sodium and purchase ted hose. Lisinopril is out the amlodipine and HCTZ . Was told by pharmacy needed to check with PCP for refills. Meds sent

## 2021-09-17 ENCOUNTER — Ambulatory Visit (INDEPENDENT_AMBULATORY_CARE_PROVIDER_SITE_OTHER): Payer: Self-pay | Admitting: Primary Care

## 2021-09-17 ENCOUNTER — Encounter (INDEPENDENT_AMBULATORY_CARE_PROVIDER_SITE_OTHER): Payer: Self-pay | Admitting: Primary Care

## 2021-09-17 VITALS — BP 126/80 | HR 81 | Temp 98.2°F | Ht 66.0 in | Wt 257.7 lb

## 2021-09-17 DIAGNOSIS — R600 Localized edema: Secondary | ICD-10-CM

## 2021-09-17 DIAGNOSIS — I10 Essential (primary) hypertension: Secondary | ICD-10-CM

## 2021-09-17 MED ORDER — FUROSEMIDE 20 MG PO TABS: 20.0000 mg | ORAL_TABLET | Freq: Every day | ORAL | 3 refills | Status: DC

## 2021-09-17 NOTE — Patient Instructions (Signed)
La retencin de lquidos tambin se llama edema o retencin de agua. Ocurre cuando partes del cuerpo se hinchan debido a la acumulacin de lquido atrapado. El lquido [place] atrapado y hace que el rea se hinche o se hinche. La retencin de lquidos es ms comn en los tobillos y los pies.

## 2021-09-23 NOTE — Progress Notes (Signed)
[facility]  Ryann Leavitt, is a 56 y.o. male  RCV:[id number]  [id number]  DOB - September 24, 1965  Chief Complaint  Patient presents with   Hypertension    Swollen legs        Subjective:   Perry Hughes is a 56 y.o. male here today for a follow up visit for the management of HTN. He also concerned bilateral lower edema- unable to wear compression hose no longer fits and hurts.  Patient has No headache, No chest pain, No abdominal pain - No Nausea, No new weakness tingling or numbness, No Cough - shortness of breath-with exertion.  No problems updated.  No Known Allergies  Past Medical History:  Diagnosis Date   Diabetes mellitus without complication (HCC)    Hypertension     Current Outpatient Medications on File Prior to Visit  Medication Sig Dispense Refill   amLODipine (NORVASC) 10 MG tablet Take 1 tablet (10 mg total) by mouth daily. 90 tablet 1   aspirin 81 MG EC tablet Take 1 tablet (81 mg total) by mouth daily. Swallow whole. 30 tablet 2   atorvastatin (LIPITOR) 80 MG tablet Take 1 tablet (80 mg total) by mouth every evening. 90 tablet 1   clopidogrel (PLAVIX) 75 MG tablet Take 1 tablet (75 mg total) by mouth daily. 90 tablet 0   hydrochlorothiazide (HYDRODIURIL) 25 MG tablet Take 1 tablet (25 mg total) by mouth daily. 90 tablet 3   lisinopril (ZESTRIL) 20 MG tablet Take 1 tablet (20 mg total) by mouth every morning. 90 tablet 2   metFORMIN (GLUCOPHAGE) 500 MG tablet Take 1 tablet (500 mg total) by mouth 2 (two) times daily with a meal. 180 tablet 2   tadalafil (CIALIS) 10 MG tablet Take 1 tablet (10 mg total) by mouth every other day as needed for erectile dysfunction. 10 tablet 1   No current facility-administered medications on file prior to visit.    Objective:   Vitals:   09/17/21 1609 09/17/21 1624  BP: (!) 146/93 126/80  Pulse: 81   Temp: 98.2 F (36.8 C)   TempSrc: Oral   SpO2: 95%   Weight: 257 lb 11.2 oz (116.9 kg)   Height:  5\' 6"  (1.676 m)     Exam General appearance : Awake, alert, not in any distress. Speech Clear. Not toxic looking HEENT: Atraumatic and Normocephalic, pupils equally reactive to light and accomodation Neck: Supple, no JVD. No cervical lymphadenopathy.  Chest: Good air entry bilaterally, no added sounds  CVS: S1 S2 regular, no murmurs.  Abdomen: Bowel sounds present, Non tender and not distended with no gaurding, rigidity or rebound. Extremities: B/L Lower Ext shows no edema, both legs are warm to touch Neurology: Awake alert, and oriented X 3,  Non focal Skin: No Rash  Data Review Lab Results  Component Value Date   HGBA1C 5.9 (A) 05/07/2021   HGBA1C 6.9 (H) 08/27/2020   HGBA1C 6.9 (H) 08/26/2020    Assessment & Plan   1. Localized edema - furosemide (LASIX) 20 MG tablet; Take 1 tablet (20 mg total) by mouth daily.  Dispense: 30 tablet; Refill: 3  2. Essential hypertension Well controlled blood pressure goal of less than 130/80, low-sodium, DASH diet, medication compliance, 150 minutes of moderate intensity exercise per week. Discussed medication compliance, adverse effects.     Patient have been counseled extensively about nutrition and exercise. Other issues discussed during this visit include: low cholesterol diet, weight control and daily exercise, foot care, annual eye examinations  at Ophthalmology, importance of adherence with medications and regular follow-up. We also discussed long term complications of uncontrolled diabetes and hypertension.   Return in about 6 months (around 03/20/2022) for Bp/prediabetes.  The patient was given clear instructions to go to ER or return to medical center if symptoms don't improve, worsen or new problems develop. The patient verbalized understanding. The patient was told to call to get lab results if they haven't heard anything in the next week.   This note has been created with Personnel officer. Any transcriptional errors are unintentional.   Grayce Sessions, NP 09/23/2021, 8:57 PM

## 2022-03-20 ENCOUNTER — Ambulatory Visit (INDEPENDENT_AMBULATORY_CARE_PROVIDER_SITE_OTHER): Payer: Self-pay | Admitting: Primary Care

## 2022-03-20 ENCOUNTER — Encounter (INDEPENDENT_AMBULATORY_CARE_PROVIDER_SITE_OTHER): Payer: Self-pay | Admitting: Primary Care

## 2022-03-20 VITALS — BP 142/89 | HR 70 | Resp 16 | Ht 64.5 in | Wt 221.6 lb

## 2022-03-20 DIAGNOSIS — E782 Mixed hyperlipidemia: Secondary | ICD-10-CM

## 2022-03-20 DIAGNOSIS — E119 Type 2 diabetes mellitus without complications: Secondary | ICD-10-CM

## 2022-03-20 DIAGNOSIS — Z1159 Encounter for screening for other viral diseases: Secondary | ICD-10-CM

## 2022-03-20 DIAGNOSIS — I10 Essential (primary) hypertension: Secondary | ICD-10-CM

## 2022-03-20 DIAGNOSIS — Z1211 Encounter for screening for malignant neoplasm of colon: Secondary | ICD-10-CM

## 2022-03-21 LAB — CBC WITH DIFFERENTIAL/PLATELET
Basophils Absolute: 0.1 10*3/uL (ref 0.0–0.2)
Basos: 1 %
EOS (ABSOLUTE): 0.2 10*3/uL (ref 0.0–0.4)
Eos: 3 %
Hematocrit: 41.7 % (ref 37.5–51.0)
Hemoglobin: 14.4 g/dL (ref 13.0–17.7)
Immature Grans (Abs): 0 10*3/uL (ref 0.0–0.1)
Immature Granulocytes: 0 %
Lymphocytes Absolute: 1.7 10*3/uL (ref 0.7–3.1)
Lymphs: 29 %
MCH: 29.6 pg (ref 26.6–33.0)
MCHC: 34.5 g/dL (ref 31.5–35.7)
MCV: 86 fL (ref 79–97)
Monocytes Absolute: 0.5 10*3/uL (ref 0.1–0.9)
Monocytes: 8 %
Neutrophils Absolute: 3.4 10*3/uL (ref 1.4–7.0)
Neutrophils: 59 %
Platelets: 294 10*3/uL (ref 150–450)
RBC: 4.86 x10E6/uL (ref 4.14–5.80)
RDW: 13.5 % (ref 11.6–15.4)
WBC: 5.8 10*3/uL (ref 3.4–10.8)

## 2022-03-21 LAB — CMP14+EGFR
ALT: 21 IU/L (ref 0–44)
AST: 21 IU/L (ref 0–40)
Albumin/Globulin Ratio: 1.8 (ref 1.2–2.2)
Albumin: 4.6 g/dL (ref 3.8–4.9)
Alkaline Phosphatase: 95 IU/L (ref 44–121)
BUN/Creatinine Ratio: 12 (ref 9–20)
BUN: 8 mg/dL (ref 6–24)
Bilirubin Total: 0.5 mg/dL (ref 0.0–1.2)
CO2: 24 mmol/L (ref 20–29)
Calcium: 9.4 mg/dL (ref 8.7–10.2)
Chloride: 104 mmol/L (ref 96–106)
Creatinine, Ser: 0.69 mg/dL — ABNORMAL LOW (ref 0.76–1.27)
Globulin, Total: 2.5 g/dL (ref 1.5–4.5)
Glucose: 89 mg/dL (ref 70–99)
Potassium: 3.8 mmol/L (ref 3.5–5.2)
Sodium: 142 mmol/L (ref 134–144)
Total Protein: 7.1 g/dL (ref 6.0–8.5)
eGFR: 109 mL/min/{1.73_m2} (ref >59)

## 2022-03-21 LAB — LIPID PANEL
Chol/HDL Ratio: 4.8 ratio (ref 0.0–5.0)
Cholesterol, Total: 216 mg/dL — ABNORMAL HIGH (ref 100–199)
HDL: 45 mg/dL (ref >39)
LDL Chol Calc (NIH): 157 mg/dL — ABNORMAL HIGH (ref 0–99)
Triglycerides: 78 mg/dL (ref 0–149)
VLDL Cholesterol Cal: 14 mg/dL (ref 5–40)

## 2022-03-21 LAB — HCV INTERPRETATION

## 2022-03-21 LAB — HCV AB W REFLEX TO QUANT PCR: HCV Ab: NONREACTIVE

## 2022-03-21 LAB — MICROALBUMIN / CREATININE URINE RATIO
Creatinine, Urine: 241.6 mg/dL
Microalb/Creat Ratio: 14 mg/g creat (ref 0–29)
Microalbumin, Urine: 32.9 ug/mL

## 2022-03-21 LAB — HEMOGLOBIN A1C
Est. average glucose Bld gHb Est-mCnc: 123 mg/dL
Hgb A1c MFr Bld: 5.9 % — ABNORMAL HIGH (ref 4.8–5.6)

## 2022-03-26 NOTE — Progress Notes (Signed)
[place], is a 57 y.o. male  [id number]:[id number]  [id number]:[id number]  DOB - 1965/03/20  Subjective:   Perry Hughes is a 57 y.o. male here today for a follow up visit. Patient has No headache, No chest pain, No abdominal pain - No Nausea, No new weakness tingling or numbness, No Cough - shortness of breath  No problems updated.  No Known Allergies  Past Medical History:  Diagnosis Date   Diabetes mellitus without complication ([place])    Hypertension     Current Outpatient Medications on File Prior to Visit  Medication Sig Dispense Refill   amLODipine (NORVASC) 10 MG tablet Take 1 tablet (10 mg total) by mouth daily. 90 tablet 1   aspirin 81 MG EC tablet Take 1 tablet (81 mg total) by mouth daily. Swallow whole. 30 tablet 2   atorvastatin (LIPITOR) 80 MG tablet Take 1 tablet (80 mg total) by mouth every evening. 90 tablet 1   clopidogrel (PLAVIX) 75 MG tablet Take 1 tablet (75 mg total) by mouth daily. 90 tablet 0   furosemide (LASIX) 20 MG tablet Take 1 tablet (20 mg total) by mouth daily. 30 tablet 3   hydrochlorothiazide (HYDRODIURIL) 25 MG tablet Take 1 tablet (25 mg total) by mouth daily. 90 tablet 3   lisinopril (ZESTRIL) 20 MG tablet Take 1 tablet (20 mg total) by mouth every morning. 90 tablet 2   metFORMIN (GLUCOPHAGE) 500 MG tablet Take 1 tablet (500 mg total) by mouth 2 (two) times daily with a meal. 180 tablet 2   tadalafil (CIALIS) 10 MG tablet Take 1 tablet (10 mg total) by mouth every other day as needed for erectile dysfunction. 10 tablet 1   No current facility-administered medications on file prior to visit.    Objective:   Vitals:   03/20/22 1133  BP: (Abnormal) 142/89  Pulse: 70  Resp: 16  SpO2: 95%  Weight: 221 lb 9.6 oz (100.5 kg)  Height: 5' 4.5" (1.638 m)    Exam General appearance : Awake, alert, not in any distress. Speech Clear. Not toxic looking HEENT: Atraumatic and Normocephalic, pupils equally reactive  to light and accomodation Neck: Supple, no JVD. No cervical lymphadenopathy.  Chest: Good air entry bilaterally, no added sounds  CVS: S1 S2 regular, no murmurs.  Abdomen: Bowel sounds present, Non tender and not distended with no gaurding, rigidity or rebound. Extremities: B/L Lower Ext shows no edema, both legs are warm to touch Neurology: Awake alert, and oriented X 3, CN II-XII intact, Non focal Skin: No Rash  Data Review Lab Results  Component Value Date   HGBA1C 5.9 (H) 03/20/2022   HGBA1C 5.9 (A) 05/07/2021   HGBA1C 6.9 (H) 08/27/2020    Assessment & Plan  Diagnoses and all orders for this visit:  Essential hypertension BP goal - < 130/80 Explained that having normal blood pressure is the goal and medications are helping to get to goal and maintain normal blood pressure. DIET: Limit salt intake, read nutrition labels to check salt content, limit fried and high fatty foods  Avoid using multisymptom OTC cold preparations that generally contain sudafed which can rise BP. Consult with pharmacist on best cold relief products to use for persons with HTN EXERCISE Discussed incorporating exercise such as walking - 30 minutes most days of the week and can do in 10 minute intervals    -     CBC with Differential -     CMP14+EGFR  Mixed hyperlipidemia  Healthy lifestyle diet of fruits vegetables fish nuts whole grains and low saturated fat . Foods high in cholesterol or liver, fatty meats,cheese, butter avocados, nuts and seeds, chocolate and fried foods. -     Lipid Panel  Colon cancer screening -     Cancel: Fecal occult blood, imunochemical  Type 2 diabetes mellitus without complication, without long-term current use of insulin ([place]) - educated on lifestyle modifications, including but not limited to diet choices and adding exercise to daily routine.   -     CMP14+EGFR -     Hemoglobin A1c -     Microalbumin / creatinine urine ratio  Need for hepatitis C screening test -      HCV Ab w Reflex to Quant PCR  Other orders -     Interpretation:    Patient have been counseled extensively about nutrition and exercise. Other issues discussed during this visit include: low cholesterol diet, weight control and daily exercise, foot care, annual eye examinations at Ophthalmology, importance of adherence with medications and regular follow-up. We also discussed long term complications of uncontrolled diabetes and hypertension.   Return in about 6 weeks (around 05/01/2022) for bp check .  The patient was given clear instructions to go to ER or return to medical center if symptoms don't improve, worsen or new problems develop. The patient verbalized understanding. The patient was told to call to get lab results if they haven't heard anything in the next week.   This note has been created with Surveyor, quantity. Any transcriptional errors are unintentional.   Perry Perna, NP 03/26/2022, 9:43 PM

## 2022-04-03 ENCOUNTER — Other Ambulatory Visit (INDEPENDENT_AMBULATORY_CARE_PROVIDER_SITE_OTHER): Payer: Self-pay | Admitting: Primary Care

## 2022-04-03 MED ORDER — ATORVASTATIN CALCIUM 40 MG PO TABS: 40.0000 mg | ORAL_TABLET | Freq: Every day | ORAL | 1 refills | Status: DC

## 2022-05-01 ENCOUNTER — Ambulatory Visit (INDEPENDENT_AMBULATORY_CARE_PROVIDER_SITE_OTHER): Payer: Self-pay

## 2022-05-02 ENCOUNTER — Ambulatory Visit (INDEPENDENT_AMBULATORY_CARE_PROVIDER_SITE_OTHER): Payer: Self-pay

## 2022-05-07 ENCOUNTER — Ambulatory Visit (INDEPENDENT_AMBULATORY_CARE_PROVIDER_SITE_OTHER): Payer: Self-pay

## 2022-05-07 VITALS — BP 143/86

## 2022-05-07 DIAGNOSIS — Z0130 Encounter for examination of blood pressure without abnormal findings: Secondary | ICD-10-CM

## 2022-05-08 ENCOUNTER — Encounter (INDEPENDENT_AMBULATORY_CARE_PROVIDER_SITE_OTHER): Payer: Self-pay

## 2022-08-08 ENCOUNTER — Encounter (INDEPENDENT_AMBULATORY_CARE_PROVIDER_SITE_OTHER): Payer: Self-pay | Admitting: Primary Care

## 2022-08-08 ENCOUNTER — Ambulatory Visit (INDEPENDENT_AMBULATORY_CARE_PROVIDER_SITE_OTHER): Payer: Self-pay | Admitting: Primary Care

## 2022-08-08 VITALS — BP 138/91 | HR 73 | Resp 16 | Wt 212.8 lb

## 2022-08-08 DIAGNOSIS — E119 Type 2 diabetes mellitus without complications: Secondary | ICD-10-CM

## 2022-08-08 DIAGNOSIS — M79671 Pain in right foot: Secondary | ICD-10-CM

## 2022-08-08 DIAGNOSIS — I10 Essential (primary) hypertension: Secondary | ICD-10-CM

## 2022-08-08 LAB — POCT GLYCOSYLATED HEMOGLOBIN (HGB A1C): HbA1c, POC (controlled diabetic range): 6.3 % (ref 0.0–7.0)

## 2022-08-08 MED ORDER — HYDROCHLOROTHIAZIDE 25 MG PO TABS: 25.0000 mg | ORAL_TABLET | Freq: Every day | ORAL | 1 refills | Status: DC

## 2022-08-08 MED ORDER — AMLODIPINE BESYLATE 10 MG PO TABS: 10.0000 mg | ORAL_TABLET | Freq: Every day | ORAL | 1 refills | Status: DC

## 2022-08-08 NOTE — Progress Notes (Signed)
[facility]  Perry Hughes, is a 57 y.o. male  [id number]:[id number]  [id number]  DOB - 02-21-1966  Chief Complaint  Patient presents with   Foot Pain    Left        Subjective:   Perry Hughes is a 57 y.o. Hispanic obese male he has given his wife Perry Hughes to be present his appointment and also interpret for him.  His chief complaint is left foot pain.  He denies neuropathy the pain is under his foot near his heel.  He is wearing sneakers today and his feet feel fine.  He has to wear special shoes for construction work.  Patient has No headache, No chest pain, No abdominal pain - No Nausea, No new weakness tingling or numbness, No Cough - shortness of breath.  He is also following up for hypertension-Patient has No headache, No chest pain, No abdominal pain - No Nausea, No new weakness tingling or numbness, No Cough - shortness of breath.  Type 2 diabetes denies polyuria, polydipsia, polyphasia, or vision changes.  No problems updated.  No Known Allergies  Past Medical History:  Diagnosis Date   Diabetes mellitus without complication (H[facility])    Hypertension     Current Outpatient Medications on File Prior to Visit  Medication Sig Dispense Refill   aspirin 81 MG EC tablet Take 1 tablet (81 mg total) by mouth daily. Swallow whole. 30 tablet 2   atorvastatin (LIPITOR) 40 MG tablet Take 1 tablet (40 mg total) by mouth daily. 90 tablet 1   tadalafil (CIALIS) 10 MG tablet Take 1 tablet (10 mg total) by mouth every other day as needed for erectile dysfunction. 10 tablet 1   No current facility-administered medications on file prior to visit.    Objective:   Vitals:   08/08/22 1627 08/08/22 1629  BP: (Abnormal) 146/91 (Abnormal) 138/91  Pulse: 73   Resp: 16   SpO2: 99%   Weight: 212 lb 12.8 oz (96.5 kg)     Comprehensive ROS Pertinent positive and negative noted in HPI   Exam General appearance : Awake, alert, not in any distress. Speech Clear. Not toxic  looking HEENT: Atraumatic and Normocephalic, pupils equally reactive to light and accomodation Neck: Supple, no JVD. No cervical lymphadenopathy.  Chest: Good air entry bilaterally, no added sounds  CVS: S1 S2 regular, no murmurs.  Abdomen: Bowel sounds present, Non tender and not distended with no gaurding, rigidity or rebound. Extremities: B/L Lower Ext shows no edema, both legs are warm to touch Neurology: Awake alert, and oriented X 3, CN II-XII intact, Non focal Skin: No Rash  Data Review Lab Results  Component Value Date   HGBA1C 6.3 08/08/2022   HGBA1C 5.9 (H) 03/20/2022   HGBA1C 5.9 (A) 05/07/2021    Assessment & Plan    [name] was seen today for foot pain.  Diagnoses and all orders for this visit:  Type 2 diabetes mellitus without complication, without long-term current use of insulin (H[facility]) -     HgB A1c 6.3 previously 5.9.  Remains prediabetic without medication. You are prediabetic .  Prediabetes is 5.7-6.4 monitor carbohydrates -rice, potatoes, tortillas, breads, pasta, sweets, sodas.  Increase exercising to help maintain appropriate weight.  - educated on lifestyle modifications, including but not limited to diet choices and adding exercise to daily routine.    Essential hypertension -     Discontinue: amLODipine (NORVASC) 10 MG tablet; Take 1 tablet (10 mg total) by mouth daily. -  hydrochlorothiazide (HYDRODIURIL) 25 MG tablet; Take 1 tablet (25 mg total) by mouth daily.  Right foot pain  Aggravating factors are standing long periods of time at work The boots he must wear for work could potentially be making the foot feel heavier while wearing the boot which could be a factor in right foot pain.  Denies numbness and tingling   Patient have been counseled extensively about nutrition and exercise. Other issues discussed during this visit include: low cholesterol diet, weight control and daily exercise, foot care, annual eye examinations at Ophthalmology,  importance of adherence with medications and regular follow-up. We also discussed long term complications of uncontrolled diabetes and hypertension.   No follow-ups on file.  The patient was given clear instructions to go to ER or return to medical center if symptoms don't improve, worsen or new problems develop. The patient verbalized understanding. The patient was told to call to get lab results if they haven't heard anything in the next week.   This note has been created with Education officer, environmental. Any transcriptional errors are unintentional.   Grayce Sessions, NP 08/11/2022, 11:32 PM Wife

## 2022-08-11 ENCOUNTER — Encounter (INDEPENDENT_AMBULATORY_CARE_PROVIDER_SITE_OTHER): Payer: Self-pay | Admitting: Primary Care

## 2023-02-12 ENCOUNTER — Telehealth: Payer: Self-pay | Admitting: Primary Care

## 2023-02-12 NOTE — Telephone Encounter (Signed)
Copied from CRM [id number]. Topic: General - Other >> Feb 12, 2023  3:03 PM Turkey B wrote: Reason for CRM: pt's wife called n asking if pt needs to come on an empty stomach for his appt, 121/19. Please cb

## 2023-02-13 ENCOUNTER — Ambulatory Visit (INDEPENDENT_AMBULATORY_CARE_PROVIDER_SITE_OTHER): Payer: Self-pay | Admitting: Primary Care

## 2023-02-13 ENCOUNTER — Encounter (INDEPENDENT_AMBULATORY_CARE_PROVIDER_SITE_OTHER): Payer: Self-pay | Admitting: Primary Care

## 2023-02-13 VITALS — BP 140/88 | HR 64 | Resp 16 | Wt 210.2 lb

## 2023-02-13 DIAGNOSIS — R7303 Prediabetes: Secondary | ICD-10-CM

## 2023-02-13 DIAGNOSIS — R350 Frequency of micturition: Secondary | ICD-10-CM

## 2023-02-13 DIAGNOSIS — E782 Mixed hyperlipidemia: Secondary | ICD-10-CM

## 2023-02-13 DIAGNOSIS — I10 Essential (primary) hypertension: Secondary | ICD-10-CM

## 2023-02-13 LAB — POCT URINALYSIS DIP (CLINITEK)
Bilirubin, UA: NEGATIVE
Blood, UA: NEGATIVE
Glucose, UA: NEGATIVE mg/dL
Ketones, POC UA: NEGATIVE mg/dL
Leukocytes, UA: NEGATIVE
Nitrite, UA: NEGATIVE
Spec Grav, UA: 1.015 (ref 1.010–1.025)
Urobilinogen, UA: 0.2 U/dL
pH, UA: 7.5 (ref 5.0–8.0)

## 2023-02-13 MED ORDER — HYDROCHLOROTHIAZIDE 25 MG PO TABS: 25.0000 mg | ORAL_TABLET | Freq: Every day | ORAL | 1 refills | Status: DC

## 2023-02-13 NOTE — Progress Notes (Signed)
[facility]  Perry Hughes, is a 57 y.o. male  [id number]  [id number]  DOB - August 07, 1965  Chief Complaint  Patient presents with   Groin Swelling    Requesting referral to urology        Subjective:   Perry Hughes is a 57 y.o. male here today for a follow up visit. Dysuria . He c/o having to urinate every 2 hours. Discussed other contributing causes and will r/o with labs. Bp is elevated doe not take Bp medication consistently. Patient has No headache, No chest pain, No abdominal pain - No Nausea, No new weakness tingling or numbness, No Cough - shortness of breath HPI  No problems updated.  Comprehensive ROS Pertinent positive and negative noted in HPI   No Known Allergies  Past Medical History:  Diagnosis Date   Diabetes mellitus without complication (HCC)    Hypertension     Current Outpatient Medications on File Prior to Visit  Medication Sig Dispense Refill   aspirin 81 MG EC tablet Take 1 tablet (81 mg total) by mouth daily. Swallow whole. 30 tablet 2   atorvastatin (LIPITOR) 40 MG tablet Take 1 tablet (40 mg total) by mouth daily. 90 tablet 1   tadalafil (CIALIS) 10 MG tablet Take 1 tablet (10 mg total) by mouth every other day as needed for erectile dysfunction. 10 tablet 1   No current facility-administered medications on file prior to visit.   Health Maintenance  Topic Date Due   DTaP/Tdap/Td vaccine (1 - Tdap) Never done   Colon Cancer Screening  Never done   Zoster (Shingles) Vaccine (1 of 2) Never done   Flu Shot  Never done   COVID-19 Vaccine (1 - 2024-25 season) Never done   Yearly kidney function blood test for diabetes  03/21/2023   Yearly kidney health urinalysis for diabetes  03/21/2023   Hepatitis C Screening  Completed   HIV Screening  Completed   HPV Vaccine  Aged Out    Objective:   Vitals:   02/13/23 1059 02/13/23 1100 02/13/23 1132  BP: (!) 146/90 (!) 143/90 (!) 140/88  Pulse: 64    Resp: 16    SpO2: 97%     Weight: 210 lb 3.2 oz (95.3 kg)       Physical Exam Vitals reviewed.  Constitutional:      Appearance: He is obese.  HENT:     Head: Normocephalic and atraumatic.     Nose: Nose normal.  Eyes:     Extraocular Movements: Extraocular movements intact.     Pupils: Pupils are equal, round, and reactive to light.  Cardiovascular:     Rate and Rhythm: Normal rate and regular rhythm.  Pulmonary:     Effort: Pulmonary effort is normal.     Breath sounds: Normal breath sounds.  Abdominal:     General: Bowel sounds are normal. There is distension.     Palpations: Abdomen is soft.  Musculoskeletal:        General: Normal range of motion.     Cervical back: Normal range of motion and neck supple.  Skin:    General: Skin is warm and dry.  Neurological:     Mental Status: He is oriented to person, place, and time.  Psychiatric:        Mood and Affect: Mood normal.        Behavior: Behavior normal.       Assessment & Plan   Vahin was seen today for groin swelling.  Diagnoses and all orders for this visit:  Frequent urination -     Ambulatory referral to Urology -     PSA -     POCT URINALYSIS DIP (CLINITEK)  Essential hypertension BP goal - < 130/80 Explained that having normal blood pressure is the goal and medications are helping to get to goal and maintain normal blood pressure. DIET: Limit salt intake, read nutrition labels to check salt content, limit fried and high fatty foods  Avoid using multisymptom OTC cold preparations that generally contain sudafed which can rise BP. Consult with pharmacist on best cold relief products to use for persons with HTN EXERCISE Discussed incorporating exercise such as walking - 30 minutes most days of the week and can do in 10 minute intervals    -     hydrochlorothiazide (HYDRODIURIL) 25 MG tablet; Take 1 tablet (25 mg total) by mouth daily.  Prediabetes -     Hemoglobin A1c  Mixed hyperlipidemia -     Lipid Panel      Patient have been counseled extensively about nutrition and exercise. Other issues discussed during this visit include: low cholesterol diet, weight control and daily exercise, foot care, annual eye examinations at Ophthalmology, importance of adherence with medications and regular follow-up. We also discussed long term complications of uncontrolled diabetes and hypertension.   Return in about 3 months (around 05/14/2023).  The patient was given clear instructions to go to ER or return to medical center if symptoms don't improve, worsen or new problems develop. The patient verbalized understanding. The patient was told to call to get lab results if they haven't heard anything in the next week.   This note has been created with Education officer, environmental. Any transcriptional errors are unintentional.   Grayce Sessions, NP 02/13/2023, 2:34 PM

## 2023-02-13 NOTE — Telephone Encounter (Signed)
Pt is in the office today.  

## 2023-02-14 LAB — LIPID PANEL
Chol/HDL Ratio: 4.6 {ratio} (ref 0.0–5.0)
Cholesterol, Total: 224 mg/dL — ABNORMAL HIGH (ref 100–199)
HDL: 49 mg/dL (ref >39)
LDL Chol Calc (NIH): 162 mg/dL — ABNORMAL HIGH (ref 0–99)
Triglycerides: 76 mg/dL (ref 0–149)
VLDL Cholesterol Cal: 13 mg/dL (ref 5–40)

## 2023-02-14 LAB — HEMOGLOBIN A1C
Est. average glucose Bld gHb Est-mCnc: 123 mg/dL
Hgb A1c MFr Bld: 5.9 % — ABNORMAL HIGH (ref 4.8–5.6)

## 2023-02-14 LAB — PSA: Prostate Specific Ag, Serum: 3.5 ng/mL (ref 0.0–4.0)

## 2023-02-18 ENCOUNTER — Ambulatory Visit (INDEPENDENT_AMBULATORY_CARE_PROVIDER_SITE_OTHER): Payer: Self-pay | Admitting: Primary Care

## 2023-02-21 ENCOUNTER — Telehealth (INDEPENDENT_AMBULATORY_CARE_PROVIDER_SITE_OTHER): Payer: Self-pay

## 2023-02-21 NOTE — Telephone Encounter (Signed)
Copied from CRM [id number]. Topic: General - Other >> Feb 21, 2023  1:56 PM Phill Myron wrote: Pt's wife calling for the results of Pt Perry Hughes's labs  (not read by provider)

## 2023-02-24 ENCOUNTER — Other Ambulatory Visit (INDEPENDENT_AMBULATORY_CARE_PROVIDER_SITE_OTHER): Payer: Self-pay | Admitting: Primary Care

## 2023-02-24 MED ORDER — PRAVASTATIN SODIUM 40 MG PO TABS: 40.0000 mg | ORAL_TABLET | Freq: Every day | ORAL | 1 refills | Status: DC

## 2023-02-24 NOTE — Telephone Encounter (Signed)
The patients spouse called in inquiring about his labs from a few weeks ago and still has not heard back from anyone. Please call ASAP with lab results.

## 2023-02-24 NOTE — Telephone Encounter (Signed)
Will forward to provider  

## 2023-02-25 ENCOUNTER — Telehealth (INDEPENDENT_AMBULATORY_CARE_PROVIDER_SITE_OTHER): Payer: Self-pay

## 2023-02-25 NOTE — Telephone Encounter (Signed)
Contacted pt to go over lab results pt is aware and pt is requesting that results be mailed  I have printed labs out and will place with out going mail

## 2023-03-03 ENCOUNTER — Encounter (INDEPENDENT_AMBULATORY_CARE_PROVIDER_SITE_OTHER): Payer: Self-pay

## 2023-03-03 ENCOUNTER — Telehealth: Payer: Self-pay | Admitting: *Deleted

## 2023-03-03 NOTE — Telephone Encounter (Signed)
 Pt wife on DPR  given lab results per notes of Juvenal Rummer, RMA from 02/25/23 on 03/03/23. Pt wife  verbalized understanding of message :   A1C- You are prediabetic .  Prediabetes is 5.7-6.4 monitor carbohydrates -rice, potatoes, tortillas, breads, pasta, sweets, sodas.  Increase exercising to help maintain appropriate weight.  PSA normal.  Your cholesterol is high, Increase risk of heart attack and/or stroke.  To reduce your Cholesterol , Remember - more fruits and vegetables, more fish, and limit red meat and dairy products.  More soy, nuts, beans, barley, lentils, oats and plant sterol ester enriched margarine instead of butter.  I also encourage eliminating sugar and processed food.  New script sent for pravastatin  40mg  nightly   Written by Juvenal JONELLE Marina, RMA on 02/25/2023 10:42 AM EST  Patient wife requesting a copy of information sent to address in chart . Patient wife reports patient has appt with urology Jan 15 if any additional medication records need to be sent.

## 2023-03-03 NOTE — Telephone Encounter (Signed)
 Results has been printed and will be mailed out tomorrow

## 2023-03-12 ENCOUNTER — Ambulatory Visit: Payer: Self-pay | Admitting: Urology

## 2023-03-12 ENCOUNTER — Encounter: Payer: Self-pay | Admitting: Urology

## 2023-03-12 VITALS — BP 145/80 | HR 73

## 2023-03-12 DIAGNOSIS — R399 Unspecified symptoms and signs involving the genitourinary system: Secondary | ICD-10-CM

## 2023-03-12 DIAGNOSIS — K4090 Unilateral inguinal hernia, without obstruction or gangrene, not specified as recurrent: Secondary | ICD-10-CM

## 2023-03-12 MED ORDER — TAMSULOSIN HCL 0.4 MG PO CAPS: 0.4000 mg | ORAL_CAPSULE | Freq: Every day | ORAL | 3 refills | Status: DC

## 2023-03-12 NOTE — Progress Notes (Addendum)
   Assessment: 1. Lower urinary tract symptoms (LUTS)      Plan: Referral to gen surgery for left inguinal hernia.  Today I had a long discussion with the patient and his wife regarding his lower urinary tract symptoms using a in person Spanish interpreter.  We discussed medical management options.  I have recommended new medical therapy. Rx: Tamsulosin  0.4 mg daily Nature of medication including proper utilization as well as adverse events and side effects reviewed.  Patient return in 4 months for recheck-May need anticholinergic/OAB med  Chief Complaint: luts  History of Present Illness:  Perry Hughes is a 58 y.o. male who is seen in consultation from Marius Siemens, NP for evaluation of lower urinary tract symptoms.. Patient also has a history of erectile dysfunction and was actually evaluated by [facility] urology and instructed on performing penile injection therapy beginning September 2024.  He was getting this through [facility] in [place].  He tried it a few times but did not like the way it made him feel.  He is no longer using injections.  The patient is here today complaining of a several year history of worsening lower urinary tract symptoms.  He states they began after he had a stroke.  He does have global symptoms with current IPSS = 15  PVR today = 0  DRE today reveals a 30 to 40 g gland without nodules or induration  PSA 01/2023 = 3.5  Past Medical History:  Past Medical History:  Diagnosis Date   Diabetes mellitus without complication (HCC)    Hypertension     Past Surgical History:  History reviewed. No pertinent surgical history.  Allergies:  No Known Allergies  Family History:  History reviewed. No pertinent family history.  Social History:  Social History   Tobacco Use   Smoking status: Former    Current packs/day: 0.00    Types: Cigarettes    Quit date: 09/13/2020    Years since quitting: 2.4   Smokeless tobacco: Never  Substance  Use Topics   Alcohol use: Yes   Drug use: No    Review of symptoms:  Constitutional:  Negative for unexplained weight loss, night sweats, fever, chills ENT:  Negative for nose bleeds, sinus pain, painful swallowing CV:  Negative for chest pain, shortness of breath, exercise intolerance, palpitations, loss of consciousness Resp:  Negative for cough, wheezing, shortness of breath GI:  Negative for nausea, vomiting, diarrhea, bloody stools GU:  Positives noted in HPI; otherwise negative for gross hematuria, dysuria, urinary incontinence Neuro:  Negative for seizures, poor balance, limb weakness, slurred speech Psych:  Negative for lack of energy, depression, anxiety Endocrine:  Negative for polydipsia, polyuria, symptoms of hypoglycemia (dizziness, hunger, sweating) Hematologic:  Negative for anemia, purpura, petechia, prolonged or excessive bleeding, use of anticoagulants  Allergic:  Negative for difficulty breathing or choking as a result of exposure to anything; no shellfish allergy; no allergic response (rash/itch) to materials, foods  Physical exam: BP (!) 145/80   Pulse 73  GENERAL APPEARANCE:  Well appearing, well developed, well nourished, NAD  GU: Normal phallus and testes bilaterally.  Large reducible left inguinal hernia which is nontender. DRE: Normal sphincter tone; prostate is approximately 30 g without evidence of nodules or induration  Results: Urinalysis entirely normal

## 2023-03-12 NOTE — Addendum Note (Signed)
 Addended by: Scarlet Curly on: 03/12/2023 02:20 PM   Modules accepted: Orders

## 2023-03-13 ENCOUNTER — Ambulatory Visit (INDEPENDENT_AMBULATORY_CARE_PROVIDER_SITE_OTHER): Payer: Self-pay | Admitting: Primary Care

## 2023-03-13 ENCOUNTER — Encounter (INDEPENDENT_AMBULATORY_CARE_PROVIDER_SITE_OTHER): Payer: Self-pay

## 2023-03-13 LAB — URINALYSIS, ROUTINE W REFLEX MICROSCOPIC
Bilirubin, UA: NEGATIVE
Glucose, UA: NEGATIVE
Ketones, UA: NEGATIVE
Leukocytes,UA: NEGATIVE
Nitrite, UA: NEGATIVE
Protein,UA: NEGATIVE
RBC, UA: NEGATIVE
Specific Gravity, UA: 1.025 (ref 1.005–1.030)
Urobilinogen, Ur: 0.2 mg/dL (ref 0.2–1.0)
pH, UA: 7 (ref 5.0–7.5)

## 2023-03-24 ENCOUNTER — Ambulatory Visit: Payer: Self-pay | Admitting: Surgery

## 2023-03-24 NOTE — H&P (Signed)
Subjective   Chief Complaint: Inguinal Hernia     History of Present Illness: Perry Hughes is a 58 y.o. male who is seen today as an office consultation at the request of Dr. Margo Aye for evaluation of Inguinal Hernia .   This is a 58 year old male who is status post a right inguinal hernia repair with mesh in 1990 performed in [facility] [place].  Approximately 1 year later, he began noticing a bulge on his left side.  He never sought any medical attention to have this repaired.  It has become very large.  There is a CT scan performed for other reasons in 2020 that showed a moderate-sized inguinal hernia containing fat and part of the bladder.  The hernia has become larger since that time.  The patient suffered a stroke several years ago but has regained full function.  He is currently on a baby aspirin.  No other abdominal surgeries.  He presents now to discuss left inguinal hernia repair.  The patient works as a Surveyor, minerals   Review of Systems: A complete review of systems was obtained from the patient.  I have reviewed this information and discussed as appropriate with the patient.  See HPI as well for other ROS.  Review of Systems  Constitutional: Negative.   HENT: Negative.    Eyes: Negative.   Respiratory: Negative.    Cardiovascular: Negative.   Gastrointestinal: Negative.   Genitourinary: Negative.   Musculoskeletal: Negative.   Skin: Negative.   Neurological: Negative.   Endo/Heme/Allergies: Negative.   Psychiatric/Behavioral: Negative.        Medical History: Past Medical History:  Diagnosis Date   Hyperlipidemia     Patient Active Problem List  Diagnosis   HTN (hypertension)   Stroke (CMS/HHS-HCC)   Left inguinal hernia   Umbilical hernia without obstruction or gangrene    Past Surgical History:  Procedure Laterality Date   HERNIA REPAIR     SPINE SURGERY       No Known Allergies  Current Outpatient Medications on File Prior to Visit  Medication  Sig Dispense Refill   hydroCHLOROthiazide (HYDRODIURIL) 25 MG tablet Take 1 tablet by mouth once daily     pravastatin (PRAVACHOL) 40 MG tablet Take 40 mg by mouth once daily     No current facility-administered medications on file prior to visit.    Family History  Problem Relation Age of Onset   Coronary Artery Disease (Blocked arteries around heart) Mother    Diabetes Father      Social History   Tobacco Use  Smoking Status Former   Types: Cigarettes   Start date: 2022  Smokeless Tobacco Never     Social History   Socioeconomic History   Marital status: Married  Tobacco Use   Smoking status: Former    Types: Cigarettes    Start date: 2022   Smokeless tobacco: Never  Substance and Sexual Activity   Alcohol use: Not Currently   Drug use: Never   Social Drivers of Health   Food Insecurity: No Food Insecurity (02/13/2023)   Received from [facility]   Hunger Vital Sign    Worried About Running Out of Food in the Last Year: Never true    Ran Out of Food in the Last Year: Never true  Transportation Needs: No Transportation Needs (02/13/2023)   Received from [facility]    Lack of Transportation (Medical): No    Lack of Transportation (Non-Medical): No  Received from [facility]   Social Network  Housing Stability: Unknown (03/24/2023)   Housing Stability Vital Sign    Homeless in the Last Year: No    Objective:    Vitals:   03/24/23 0930  BP: (!) 147/87  Pulse: 87  Temp: 36.7 C (98 F)  SpO2: 98%  Weight: 98 kg (216 lb)  Height: 167.6 cm (5\' 6" )    Body mass index is 34.86 kg/m.  Physical Exam   Constitutional:  WDWN in NAD, conversant, no obvious deformities; lying in bed comfortably Eyes:  Pupils equal, round; sclera anicteric; moist conjunctiva; no lid lag HENT:  Oral mucosa moist; good dentition  Neck:  No masses palpated, trachea midline; no thyromegaly Lungs:  CTA bilaterally; normal respiratory effort CV:   Regular rate and rhythm; no murmurs; extremities well-perfused with no edema Abd:  +bowel sounds, soft, non-tender, no palpable organomegaly; small umbilical hernia that is easily reducible.  The fascial defect seems to be less than 1 cm. GU: Bilateral descended testes, no testicular masses, very large left inguinal hernia that bulges down into the scrotum.  This is reducible when he is supine.  No sign of left inguinal hernia. Musc: Normal gait; no apparent clubbing or cyanosis in extremities Lymphatic:  No palpable cervical or axillary lymphadenopathy Skin:  Warm, dry; no sign of jaundice Psychiatric - alert and oriented x 4; calm mood and affect   Labs, Imaging and Diagnostic Testing: EXAM: CT ABDOMEN AND PELVIS WITHOUT CONTRAST   TECHNIQUE: Multidetector CT imaging of the abdomen and pelvis was performed following the standard protocol without IV contrast.   COMPARISON:  None.   FINDINGS: Lower chest: Lung bases are clear.   Hepatobiliary: Mild hepatic steatosis.   Gallbladder is unremarkable. No intrahepatic or extrahepatic ductal dilatation.   Pancreas: Within normal limits.   Spleen: Within normal limits.   Adrenals/Urinary Tract: Adrenal glands are within normal limits.   Right kidney is within normal limits. 5 mm nonobstructing left lower pole renal calculus (series 3/image 41). Mild left hydroureteronephrosis. Associated 6 mm distal left ureteral calculus below the level of the sacral ureter (coronal image 59).   Thick-walled bladder, although underdistended. Associated herniation of the left anterior bladder into an inguinal hernia (series 3/image 87).   Stomach/Bowel: Stomach is within normal limits.   No evidence of bowel obstruction.   Normal appendix (series 3/image 58).   Mild left colonic diverticulosis, without evidence of diverticulitis.   Vascular/Lymphatic: No evidence of abdominal aortic aneurysm.   Atherosclerotic calcifications of the  abdominal aorta and branch vessels.   No suspicious abdominopelvic lymphadenopathy.   Reproductive: Prostate is grossly unremarkable.   Other: No abdominopelvic ascites.   Moderate fat/bladder containing left inguinal hernia (series 3/image 93).   Musculoskeletal: Degenerative changes of the lumbar spine.   IMPRESSION: 6 mm distal left ureteral calculus below the level of the sacral ureter. Associated mild left hydroureteronephrosis.   Additional 5 mm nonobstructing left lower pole renal calculus.   Moderate fat/bladder containing left inguinal hernia.   Additional ancillary findings as above.     Electronically Signed   By: Charline Bills M.D.   On: 07/25/2018 05:45    Assessment and Plan:  Diagnoses and all orders for this visit:  Left inguinal hernia  Umbilical hernia without obstruction or gangrene    Recommend open repair of this large left inguinal hernia with mesh.  We will also primarily repair his small umbilical hernia at the same time.The surgical procedure has  been discussed with the patient.  Potential risks, benefits, alternative treatments, and expected outcomes have been explained.  All of the patient's questions at this time have been answered.  The likelihood of reaching the patient's treatment goal is good.  The patient understands the proposed surgical procedure and wishes to proceed.    Perry Mcpartlin Delbert Harness, MD  03/24/2023 11:10 AM

## 2023-05-15 ENCOUNTER — Ambulatory Visit (INDEPENDENT_AMBULATORY_CARE_PROVIDER_SITE_OTHER): Payer: Self-pay | Admitting: Primary Care

## 2023-05-19 ENCOUNTER — Ambulatory Visit (INDEPENDENT_AMBULATORY_CARE_PROVIDER_SITE_OTHER): Payer: Self-pay | Admitting: Primary Care

## 2023-05-19 DIAGNOSIS — R7303 Prediabetes: Secondary | ICD-10-CM

## 2023-05-19 DIAGNOSIS — E782 Mixed hyperlipidemia: Secondary | ICD-10-CM

## 2023-05-19 DIAGNOSIS — Z1211 Encounter for screening for malignant neoplasm of colon: Secondary | ICD-10-CM

## 2023-05-19 DIAGNOSIS — I10 Essential (primary) hypertension: Secondary | ICD-10-CM

## 2023-05-28 ENCOUNTER — Ambulatory Visit (INDEPENDENT_AMBULATORY_CARE_PROVIDER_SITE_OTHER): Payer: Self-pay | Admitting: Primary Care

## 2023-05-28 ENCOUNTER — Encounter (INDEPENDENT_AMBULATORY_CARE_PROVIDER_SITE_OTHER): Payer: Self-pay | Admitting: Primary Care

## 2023-05-28 VITALS — BP 130/84 | HR 69 | Wt 219.0 lb

## 2023-05-28 DIAGNOSIS — R21 Rash and other nonspecific skin eruption: Secondary | ICD-10-CM

## 2023-05-28 DIAGNOSIS — L240 Irritant contact dermatitis due to detergents: Secondary | ICD-10-CM

## 2023-05-28 NOTE — Progress Notes (Signed)
 [facility]  Perry Hughes, is a 59 y.o. male  MVH:[id number]  WUX:[id number]  DOB - 1965/06/07  Chief Complaint  Patient presents with   Medical Management of Chronic Issues    Rash on upper stomach HAD IT FOR 10 Days       Subjective:   Perry Hughes is a 58 y.o. male here today for an acute visit.  Rash This is a new problem. The current episode started 1 to 4 weeks ago. The problem has been waxing and waning since onset. The affected locations include the right arm, right hand, right wrist, right elbow, chest, left arm and left elbow. The rash is characterized by redness and itchiness. He was exposed to plant contact. Past treatments include nothing. The treatment provided no relief.  Also, wife remembered changing detergent. Contact dermatitis is hypersensitivity reaction to a substance causing cellular immunity response. This may cause papules, vesicles, bullae with surrounding erythema and pruritic .Conservative treatment luke warm baths, Aveeno oatmeal bath or emollients. Medications can be discussed at this visit- antihistamine or pruritic medications. Causes may be contributed to changes in soaps, deodorants, laundry detergent or environmental- grass, weeds and trees.   No problems updated.  Comprehensive ROS Pertinent positive and negative noted in HPI   No Known Allergies  Past Medical History:  Diagnosis Date   Diabetes mellitus without complication (HCC)    Hypertension     Current Outpatient Medications on File Prior to Visit  Medication Sig Dispense Refill   hydrochlorothiazide (HYDRODIURIL) 25 MG tablet Take 1 tablet (25 mg total) by mouth daily. 90 tablet 1   pravastatin (PRAVACHOL) 40 MG tablet Take 1 tablet (40 mg total) by mouth daily. 90 tablet 1   tamsulosin (FLOMAX) 0.4 MG CAPS capsule Take 1 capsule (0.4 mg total) by mouth daily after supper. 90 capsule 3   aspirin 81 MG EC tablet Take 1 tablet (81 mg total) by mouth daily. Swallow  whole. (Patient not taking: Reported on 05/28/2023) 30 tablet 2   tadalafil (CIALIS) 10 MG tablet Take 1 tablet (10 mg total) by mouth every other day as needed for erectile dysfunction. (Patient not taking: Reported on 05/28/2023) 10 tablet 1   No current facility-administered medications on file prior to visit.   Health Maintenance  Topic Date Due   Colon Cancer Screening  Never done   Stool Blood Test  06/19/2022   COVID-19 Vaccine (1 - 2024-25 season) Never done   Yearly kidney function blood test for diabetes  03/21/2023   Yearly kidney health urinalysis for diabetes  03/21/2023   Zoster (Shingles) Vaccine (1 of 2) 08/27/2023*   DTaP/Tdap/Td vaccine (1 - Tdap) 05/27/2024*   Pneumococcal Vaccination (1 of 2 - PCV) 05/27/2024*   Flu Shot  09/26/2023   Hepatitis C Screening  Completed   HIV Screening  Completed   HPV Vaccine  Aged Out  *Topic was postponed. The date shown is not the original due date.    Objective:   Vitals:   05/28/23 1534 05/28/23 1553  BP: (!) 158/87 (!) 140/88  Pulse: 69   SpO2: 98%   Weight: 219 lb (99.3 kg)    BP Readings from Last 3 Encounters:  05/28/23 130/84  03/12/23 (!) 145/80  02/13/23 (!) 140/88      Physical Exam Vitals reviewed.  Constitutional:      Appearance: He is obese.  HENT:     Head: Normocephalic.     Right Ear: Tympanic membrane and external ear normal.  Left Ear: Tympanic membrane and external ear normal.     Nose: Nose normal.  Eyes:     Extraocular Movements: Extraocular movements intact.     Pupils: Pupils are equal, round, and reactive to light.  Cardiovascular:     Rate and Rhythm: Normal rate and regular rhythm.  Pulmonary:     Effort: Pulmonary effort is normal.     Breath sounds: Normal breath sounds.  Abdominal:     General: Bowel sounds are normal. There is distension.     Palpations: Abdomen is soft.  Musculoskeletal:        General: Normal range of motion.  Skin:    Findings: Rash present.   Neurological:     Mental Status: He is oriented to person, place, and time.  Psychiatric:        Mood and Affect: Mood normal.        Behavior: Behavior normal.        Thought Content: Thought content normal.        Judgment: Judgment normal.       Assessment & Plan  Hasnain was seen today for medical management of chronic issues.  Diagnoses and all orders for this visit:  Rash and nonspecific skin eruption 2/2 Irritant contact dermatitis due to detergent   Purchase calamine lotion  Patient have been counseled extensively about nutrition and exercise. Other issues discussed during this visit include: low cholesterol diet, weight control and daily exercise, foot care, annual eye examinations at Ophthalmology, importance of adherence with medications and regular follow-up. We also discussed long term complications of uncontrolled diabetes and hypertension.   Return in about 6 months (around 11/27/2023) for diabetes, fasting labs.  The patient was given clear instructions to go to ER or return to medical center if symptoms don't improve, worsen or new problems develop. The patient verbalized understanding. The patient was told to call to get lab results if they haven't heard anything in the next week.   This note has been created with Education officer, environmental. Any transcriptional errors are unintentional.   Grayce Sessions, NP 05/28/2023, 4:27 PM

## 2023-05-28 NOTE — Patient Instructions (Signed)
 calamine lotion

## 2023-05-29 ENCOUNTER — Other Ambulatory Visit (INDEPENDENT_AMBULATORY_CARE_PROVIDER_SITE_OTHER): Payer: Self-pay | Admitting: Primary Care

## 2023-05-29 DIAGNOSIS — I10 Essential (primary) hypertension: Secondary | ICD-10-CM

## 2023-05-29 NOTE — Telephone Encounter (Signed)
 Copied from CRM [id number]. Topic: Clinical - Medication Refill >> May 29, 2023 10:20 AM Almira Coaster wrote: Most Recent Primary Care Visit:  Provider: Grayce Sessions  Department: [facility]  Visit Type: OFFICE VISIT  Date: 05/28/2023  Medication: hydrochlorothiazide (HYDRODIURIL) 25 MG tablet  Has the patient contacted their pharmacy? No (Agent: If no, request that the patient contact the pharmacy for the refill. If patient does not wish to contact the pharmacy document the reason why and proceed with request.) (Agent: If yes, when and what did the pharmacy advise?)  Is this the correct pharmacy for this prescription? Yes If no, delete pharmacy and type the correct one.  This is the patient's preferred pharmacy:  [facility] [address] ([address]), [place] - [address] [place] ([place]) [place] [address] Phone: [phone] Fax: [phone]     Has the prescription been filled recently? No  Is the patient out of the medication? Yes  Has the patient been seen for an appointment in the last year OR does the patient have an upcoming appointment? Yes  Can we respond through MyChart? Yes  Agent: Please be advised that Rx refills may take up to 3 business days. We ask that you follow-up with your pharmacy.

## 2023-05-30 MED ORDER — HYDROCHLOROTHIAZIDE 25 MG PO TABS: 25.0000 mg | ORAL_TABLET | Freq: Every day | ORAL | 1 refills | Status: AC

## 2023-05-30 NOTE — Telephone Encounter (Signed)
 Requested Prescriptions  Pending Prescriptions Disp Refills   hydrochlorothiazide (HYDRODIURIL) 25 MG tablet 90 tablet 1    Sig: Take 1 tablet (25 mg total) by mouth daily.     Cardiovascular: Diuretics - Thiazide Failed - 05/30/2023 12:12 PM      Failed - Cr in normal range and within 180 days    Creatinine, Ser  Date Value Ref Range Status  03/20/2022 0.69 (L) 0.76 - 1.27 mg/dL Final         Failed - K in normal range and within 180 days    Potassium  Date Value Ref Range Status  03/20/2022 3.8 3.5 - 5.2 mmol/L Final         Failed - Na in normal range and within 180 days    Sodium  Date Value Ref Range Status  03/20/2022 142 134 - 144 mmol/L Final         Passed - Last BP in normal range    BP Readings from Last 1 Encounters:  05/28/23 130/84         Passed - Valid encounter within last 6 months    Recent Outpatient Visits           2 days ago Rash and nonspecific skin eruption   [place] [facility] Grayce Sessions, NP   3 months ago Frequent urination   [place] [facility] Grayce Sessions, NP   9 months ago Type 2 diabetes mellitus without complication, without long-term current use of insulin (HCC)   [place] [facility] Grayce Sessions, NP   1 year ago Essential hypertension   [place] [facility] Grayce Sessions, NP   1 year ago Localized edema   [place] [facility] Grayce Sessions, NP       Future Appointments             In 1 week Joline Maxcy, MD [facility] at [facility]

## 2023-06-10 ENCOUNTER — Ambulatory Visit: Payer: Self-pay | Admitting: Urology

## 2023-06-10 ENCOUNTER — Encounter: Payer: Self-pay | Admitting: Urology

## 2023-06-10 VITALS — BP 144/92 | HR 61

## 2023-06-10 DIAGNOSIS — E1169 Type 2 diabetes mellitus with other specified complication: Secondary | ICD-10-CM

## 2023-06-10 DIAGNOSIS — N521 Erectile dysfunction due to diseases classified elsewhere: Secondary | ICD-10-CM

## 2023-06-10 DIAGNOSIS — N529 Male erectile dysfunction, unspecified: Secondary | ICD-10-CM

## 2023-06-10 DIAGNOSIS — R399 Unspecified symptoms and signs involving the genitourinary system: Secondary | ICD-10-CM

## 2023-06-10 DIAGNOSIS — R972 Elevated prostate specific antigen [PSA]: Secondary | ICD-10-CM

## 2023-06-10 LAB — URINALYSIS, ROUTINE W REFLEX MICROSCOPIC
Bilirubin, UA: NEGATIVE
Glucose, UA: NEGATIVE
Leukocytes,UA: NEGATIVE
Nitrite, UA: NEGATIVE
RBC, UA: NEGATIVE
Specific Gravity, UA: 1.02 (ref 1.005–1.030)
Urobilinogen, Ur: 0.2 mg/dL (ref 0.2–1.0)
pH, UA: 6 (ref 5.0–7.5)

## 2023-06-10 LAB — MICROSCOPIC EXAMINATION

## 2023-06-10 MED ORDER — TAMSULOSIN HCL 0.4 MG PO CAPS: 0.4000 mg | ORAL_CAPSULE | Freq: Every day | ORAL | 3 refills | Status: AC

## 2023-06-10 MED ORDER — TADALAFIL 10 MG PO TABS: 10.0000 mg | ORAL_TABLET | ORAL | 11 refills | Status: DC | PRN

## 2023-06-10 NOTE — Progress Notes (Signed)
   Assessment: 1. Lower urinary tract symptoms (LUTS)   2. Erectile dysfunction of organic origin     Plan: Continue tamsulosin for lower urinary tract symptoms Continue Cialis for erectile dysfunction Will arrange for short-term follow-up for recheck and also repeat PSA given borderline reading from last December.  Chief Complaint: LUTS/ED  HPI: Perry Hughes is a 58 y.o. male who presents for continued evaluation of LUTS and ED.  Please see my note 03/12/2023 at the time of initial visit for detailed history and exam.  In summary, patient presented with a several year history of worsening lower urinary tract symptoms that he states began after he had a stroke a few years ago. Baseline IPSS = 15 PVR was 0 DRE revealed 30 to 40 g prostate without nodules or induration PSA 01/2023 = 3.5  Patient also has a history of VED that has previously responded to Cialis.  At the time of his initial visit he was found to have a significant inguinal hernia which has been repaired since his last visit.  He also was started on tamsulosin and has had a excellent response with marked improvement. Current IPSS = 6.  Portions of the above documentation were copied from a prior visit for review purposes only.  Allergies: No Known Allergies  PMH: Past Medical History:  Diagnosis Date   Diabetes mellitus without complication (HCC)    Hypertension     PSH: No past surgical history on file.  SH: Social History   Tobacco Use   Smoking status: Former    Current packs/day: 0.00    Types: Cigarettes    Quit date: 09/13/2020    Years since quitting: 2.7   Smokeless tobacco: Never  Substance Use Topics   Alcohol use: Yes   Drug use: No    ROS: Constitutional:  Negative for fever, chills, weight loss CV: Negative for chest pain, previous MI, hypertension Respiratory:  Negative for shortness of breath, wheezing, sleep apnea, frequent cough GI:  Negative for nausea, vomiting, bloody  stool, GERD  PE: BP (!) 144/92   Pulse 61  GENERAL APPEARANCE:  Well appearing, well developed, well nourished, NAD    Results: UA neg for significant blood or infection

## 2023-09-09 ENCOUNTER — Other Ambulatory Visit: Payer: Self-pay

## 2023-09-09 DIAGNOSIS — R972 Elevated prostate specific antigen [PSA]: Secondary | ICD-10-CM

## 2023-09-09 DIAGNOSIS — N529 Male erectile dysfunction, unspecified: Secondary | ICD-10-CM

## 2023-09-09 DIAGNOSIS — R399 Unspecified symptoms and signs involving the genitourinary system: Secondary | ICD-10-CM

## 2023-09-09 NOTE — Progress Notes (Unsigned)
   Assessment: 1. Lower urinary tract symptoms (LUTS)   2. Erectile dysfunction of organic origin     Plan: I personally reviewed the patient's chart including provider notes, and lab results.   Chief Complaint: No chief complaint on file.   HPI: Perry Hughes is a 58 y.o. male who presents for continued evaluation of lower urinary tract symptoms and erectile dysfunction. He was previously followed by Dr. Shona and was last seen in April 2025.  Urologic History: In summary, patient presented in January 2025 with a several year history of worsening lower urinary tract symptoms that he states began after he had a stroke a few years prior. Baseline IPSS = 15 PVR was 0 ml. DRE revealed 30 to 40 g prostate without nodules or induration PSA 01/2023 = 3.5 He was started on tamsulosin  in January 2025 and had a excellent response with marked improvement. IPSS = 6.   Patient also has a history of VED that had previously responded to Cialis .   At the time of his initial visit he was found to have a significant inguinal hernia which has been repaired.   Portions of the above documentation were copied from a prior visit for review purposes only.  Allergies: No Known Allergies  PMH: Past Medical History:  Diagnosis Date   Diabetes mellitus without complication (HCC)    Hypertension     PSH: No past surgical history on file.  SH: Social History   Tobacco Use   Smoking status: Former    Current packs/day: 0.00    Types: Cigarettes    Quit date: 09/13/2020    Years since quitting: 2.9   Smokeless tobacco: Never  Substance Use Topics   Alcohol use: Yes   Drug use: No    ROS: Constitutional:  Negative for fever, chills, weight loss CV: Negative for chest pain, previous MI, hypertension Respiratory:  Negative for shortness of breath, wheezing, sleep apnea, frequent cough GI:  Negative for nausea, vomiting, bloody stool, GERD  PE: There were no vitals taken for this  visit. GENERAL APPEARANCE:  Well appearing, well developed, well nourished, NAD HEENT:  Atraumatic, normocephalic, oropharynx clear NECK:  Supple without lymphadenopathy or thyromegaly ABDOMEN:  Soft, non-tender, no masses EXTREMITIES:  Moves all extremities well, without clubbing, cyanosis, or edema NEUROLOGIC:  Alert and oriented x 3, normal gait, CN II-XII grossly intact MENTAL STATUS:  appropriate BACK:  Non-tender to palpation, No CVAT SKIN:  Warm, dry, and intact   Results: No results found for this or any previous visit (from the past 24 hours).

## 2023-09-10 LAB — PSA: Prostate Specific Ag, Serum: 3.1 ng/mL (ref 0.0–4.0)

## 2023-09-16 ENCOUNTER — Ambulatory Visit: Payer: Self-pay | Admitting: Urology

## 2023-09-16 ENCOUNTER — Encounter: Payer: Self-pay | Admitting: Urology

## 2023-09-16 VITALS — BP 126/79 | HR 62 | Ht 66.0 in | Wt 210.0 lb

## 2023-09-16 DIAGNOSIS — N529 Male erectile dysfunction, unspecified: Secondary | ICD-10-CM

## 2023-09-16 DIAGNOSIS — N138 Other obstructive and reflux uropathy: Secondary | ICD-10-CM

## 2023-09-16 DIAGNOSIS — N401 Enlarged prostate with lower urinary tract symptoms: Secondary | ICD-10-CM

## 2023-09-16 LAB — URINALYSIS, ROUTINE W REFLEX MICROSCOPIC
Bilirubin, UA: NEGATIVE
Leukocytes,UA: NEGATIVE
Nitrite, UA: NEGATIVE
Protein,UA: NEGATIVE
RBC, UA: NEGATIVE
Specific Gravity, UA: 1.02 (ref 1.005–1.030)
Urobilinogen, Ur: 0.2 mg/dL (ref 0.2–1.0)
pH, UA: 6 (ref 5.0–7.5)

## 2023-09-16 LAB — MICROSCOPIC EXAMINATION: Bacteria, UA: NONE SEEN

## 2023-09-16 MED ORDER — TADALAFIL 20 MG PO TABS: 20.0000 mg | ORAL_TABLET | Freq: Every day | ORAL | 11 refills | Status: AC | PRN

## 2023-09-16 NOTE — Progress Notes (Signed)
   Assessment: 1. BPH with obstruction/lower urinary tract symptoms   2. Erectile dysfunction of organic origin     Plan: I personally reviewed the patient's chart including provider notes, and lab results. Continue tamsulosin  0.4 mg daily. Prescription for tadalafil  20 mg as needed intercourse sent to his pharmacy.  GoodRx coupon provided. Return to office in 6 months.  Chief Complaint: Chief Complaint  Patient presents with   Benign Prostatic Hypertrophy    HPI: Perry Hughes is a 58 y.o. male who presents for continued evaluation of BPH with lower urinary tract symptoms and erectile dysfunction. He was previously followed by Dr. Shona and was last seen in April 2025.  Urologic History: In summary, patient presented in January 2025 with a several year history of worsening lower urinary tract symptoms that he states began after he had a stroke a few years prior. Baseline IPSS = 15 PVR was 0 ml. DRE revealed 30 to 40 g prostate without nodules or induration PSA 01/2023 = 3.5 He was started on tamsulosin  in January 2025 and had a excellent response with marked improvement. IPSS = 6.  Patient also has a history of VED that had previously responded to Cialis .   At the time of his initial visit he was found to have a significant inguinal hernia which has been repaired.   PSA results: 12/24 3.5 7/25 3.1  He returns today for follow-up.  He continues on tamsulosin  0.4 mg daily.  His urinary symptoms are well-controlled.  No dysuria or gross hematuria. IPSS = 3/1. He is not currently taking tadalafil .  He would like a prescription however.  Portions of the above documentation were copied from a prior visit for review purposes only.  Allergies: No Known Allergies  PMH: Past Medical History:  Diagnosis Date   Diabetes mellitus without complication (HCC)    Hypertension     PSH: No past surgical history on file.  SH: Social History   Tobacco Use   Smoking status:  Former    Current packs/day: 0.00    Types: Cigarettes    Quit date: 09/13/2020    Years since quitting: 3.0   Smokeless tobacco: Never  Substance Use Topics   Alcohol use: Yes   Drug use: No    ROS: Constitutional:  Negative for fever, chills, weight loss CV: Negative for chest pain, previous MI, hypertension Respiratory:  Negative for shortness of breath, wheezing, sleep apnea, frequent cough GI:  Negative for nausea, vomiting, bloody stool, GERD  PE: BP 126/79   Pulse 62   Ht 5' 6 (1.676 m)   Wt 210 lb (95.3 kg)   BMI 33.89 kg/m  GENERAL APPEARANCE:  Well appearing, well developed, well nourished, NAD HEENT:  Atraumatic, normocephalic, oropharynx clear NECK:  Supple without lymphadenopathy or thyromegaly ABDOMEN:  Soft, non-tender, no masses EXTREMITIES:  Moves all extremities well, without clubbing, cyanosis, or edema NEUROLOGIC:  Alert and oriented x 3, normal gait, CN II-XII grossly intact MENTAL STATUS:  appropriate BACK:  Non-tender to palpation, No CVAT SKIN:  Warm, dry, and intact   Results: U/A:-5 WBCs, 0-2 RBCs

## 2023-11-24 ENCOUNTER — Telehealth (INDEPENDENT_AMBULATORY_CARE_PROVIDER_SITE_OTHER): Payer: Self-pay | Admitting: Primary Care

## 2023-11-24 NOTE — Telephone Encounter (Signed)
 Called pt to reschedule appt. Pt did not answer and LVM about pt rescheduling an appt for a day provider will be in office.

## 2023-11-27 ENCOUNTER — Ambulatory Visit (INDEPENDENT_AMBULATORY_CARE_PROVIDER_SITE_OTHER): Payer: Self-pay | Admitting: Primary Care

## 2023-11-27 ENCOUNTER — Ambulatory Visit: Payer: Self-pay | Admitting: Physician Assistant

## 2024-01-13 ENCOUNTER — Ambulatory Visit (INDEPENDENT_AMBULATORY_CARE_PROVIDER_SITE_OTHER): Payer: Self-pay | Admitting: Primary Care

## 2024-02-10 ENCOUNTER — Encounter (INDEPENDENT_AMBULATORY_CARE_PROVIDER_SITE_OTHER): Payer: Self-pay | Admitting: Primary Care

## 2024-02-10 ENCOUNTER — Ambulatory Visit (INDEPENDENT_AMBULATORY_CARE_PROVIDER_SITE_OTHER): Payer: Self-pay | Admitting: Primary Care

## 2024-02-10 ENCOUNTER — Other Ambulatory Visit (HOSPITAL_COMMUNITY): Payer: Self-pay

## 2024-02-10 VITALS — BP 152/82 | HR 76 | Resp 16 | Wt 202.6 lb

## 2024-02-10 DIAGNOSIS — I10 Essential (primary) hypertension: Secondary | ICD-10-CM

## 2024-02-10 DIAGNOSIS — R7303 Prediabetes: Secondary | ICD-10-CM

## 2024-02-10 DIAGNOSIS — E782 Mixed hyperlipidemia: Secondary | ICD-10-CM

## 2024-02-10 DIAGNOSIS — Z1211 Encounter for screening for malignant neoplasm of colon: Secondary | ICD-10-CM

## 2024-02-10 MED ORDER — AMLODIPINE BESYLATE 5 MG PO TABS
5.0000 mg | ORAL_TABLET | Freq: Every day | ORAL | 1 refills | Status: DC
  Filled 2024-02-10 – 2024-03-05 (×2): qty 90, 90d supply, fill #0

## 2024-02-10 NOTE — Progress Notes (Signed)
 [facility]  Perry Hughes, is a 58 y.o. male  RDW:[id number]  FMW:[id number]  DOB - Jan 20, 1966  Chief Complaint  Patient presents with   Hypertension       Subjective:   Perry Hughes is a 58 y.o. male here today for a follow up visit. Patient has No headache, No chest pain, No abdominal pain - No Nausea, No new weakness tingling or numbness, No Cough - shortness of breath Bp not at goal will add CCB low does. Patient has given wife Perry Hughes permission to be at his appt and interrupter for him.   No problems updated.  Comprehensive ROS Pertinent positive and negative noted in HPI   Allergies[1]  Past Medical History:  Diagnosis Date   Diabetes mellitus without complication (HCC)    Hypertension     Medications Ordered Prior to Encounter[2] Health Maintenance  Topic Date Due   Hepatitis B Vaccine (1 of 3 - 19+ 3-dose series) Never done   Colon Cancer Screening  Never done   Stool Blood Test  06/19/2022   Yearly kidney function blood test for diabetes  03/21/2023   Yearly kidney health urinalysis for diabetes  03/21/2023   COVID-19 Vaccine (1 - 2025-26 season) Never done   Zoster (Shingles) Vaccine (1 of 2) 05/10/2024*   Flu Shot  05/25/2024*   DTaP/Tdap/Td vaccine (1 - Tdap) 05/27/2024*   Pneumococcal Vaccine for age over 33 (1 of 2 - PCV) 02/09/2025*   Hepatitis C Screening  Completed   HIV Screening  Completed   HPV Vaccine  Aged Out   Meningitis B Vaccine  Aged Out  *Topic was postponed. The date shown is not the original due date.    Objective:   Vitals:   02/10/24 1457 02/10/24 1458  BP: (!) 164/82 (!) 152/82  Pulse: 76   Resp: 16   SpO2: 99%   Weight: 202 lb 9.6 oz (91.9 kg)    BP Readings from Last 3 Encounters:  02/10/24 (!) 152/82  09/16/23 126/79  06/10/23 (!) 144/92      Physical Exam Vitals reviewed.  Constitutional:      Appearance: Normal appearance. He is obese.  HENT:     Head: Normocephalic.     Right Ear:  Tympanic membrane, ear canal and external ear normal.     Left Ear: Tympanic membrane, ear canal and external ear normal.     Nose: Nose normal.  Eyes:     Extraocular Movements: Extraocular movements intact.     Pupils: Pupils are equal, round, and reactive to light.  Cardiovascular:     Rate and Rhythm: Normal rate and regular rhythm.  Pulmonary:     Effort: Pulmonary effort is normal.     Breath sounds: Normal breath sounds.  Abdominal:     General: Bowel sounds are normal. There is distension.     Palpations: Abdomen is soft.  Musculoskeletal:        General: Normal range of motion.     Cervical back: Normal range of motion and neck supple.  Skin:    General: Skin is warm and dry.  Neurological:     Mental Status: He is oriented to person, place, and time.  Psychiatric:        Mood and Affect: Mood normal.        Behavior: Behavior normal.        Thought Content: Thought content normal.        Judgment: Judgment normal.  Assessment & Plan   Perry Hughes was seen today for hypertension.  Diagnoses and all orders for this visit:  Essential hypertension BP goal - < 130/80 Explained that having normal blood pressure is the goal and medications are helping to get to goal and maintain normal blood pressure. DIET: Limit salt intake, read nutrition labels to check salt content, limit fried and high fatty foods  Avoid using multisymptom OTC cold preparations that generally contain sudafed which can rise BP. Consult with pharmacist on best cold relief products to use for persons with HTN EXERCISE Discussed incorporating exercise such as walking - 30 minutes most days of the week and can do in 10 minute intervals    -     CBC with Differential/Platelet -     CMP14+EGFR -     amLODipine  (NORVASC ) 5 MG tablet; Take 1 tablet (5 mg total) by mouth daily.  Prediabetes  monitor carbohydrates -rice, potatoes, tortillas, [place] [employer] Flour, breads, pasta, sweets, sodas.   Increase exercising to help maintain appropriate weight.  -     CMP14+EGFR -     Hemoglobin A1c -     Microalbumin / creatinine urine ratio  Mixed hyperlipidemia -     Lipid panel  Screening for colon cancer -     Fecal occult blood, imunochemical; Future     Patient have been counseled extensively about nutrition and exercise. Other issues discussed during this visit include: low cholesterol diet, weight control and daily exercise, foot care, annual eye examinations at Ophthalmology, importance of adherence with medications and regular follow-up. We also discussed long term complications of uncontrolled diabetes and hypertension.   Return in about 3 weeks (around 03/02/2024) for re-check blood pressure.  The patient was given clear instructions to go to ER or return to medical center if symptoms don't improve, worsen or new problems develop. The patient verbalized understanding. The patient was told to call to get lab results if they haven't heard anything in the next week.   This note has been created with Education officer, environmental. Any transcriptional errors are unintentional.   Rosaline SHAUNNA Bohr, NP 02/10/2024, 4:18 PM     [1] No Known Allergies [2]  Current Outpatient Medications on File Prior to Visit  Medication Sig Dispense Refill   aspirin  81 MG EC tablet Take 1 tablet (81 mg total) by mouth daily. Swallow whole. 30 tablet 2   hydrochlorothiazide  (HYDRODIURIL ) 25 MG tablet Take 1 tablet (25 mg total) by mouth daily. 90 tablet 1   pravastatin  (PRAVACHOL ) 40 MG tablet Take 1 tablet (40 mg total) by mouth daily. 90 tablet 1   tadalafil  (CIALIS ) 20 MG tablet Take 1 tablet (20 mg total) by mouth daily as needed. 10 tablet 11   tamsulosin  (FLOMAX ) 0.4 MG CAPS capsule Take 1 capsule (0.4 mg total) by mouth daily after supper. 90 capsule 3   No current facility-administered medications on file prior to visit.

## 2024-02-10 NOTE — Patient Instructions (Signed)
 [address]  [place], [place]  [address]  [address], [place], [place]     8:30am -4:30pm    [phone]

## 2024-02-11 LAB — MICROALBUMIN / CREATININE URINE RATIO
Creatinine, Urine: 181 mg/dL
Microalb/Creat Ratio: 9 mg/g{creat} (ref 0–29)
Microalbumin, Urine: 16.8 ug/mL

## 2024-02-11 LAB — CMP14+EGFR
ALT: 17 IU/L (ref 0–44)
AST: 18 IU/L (ref 0–40)
Albumin: 4 g/dL (ref 3.8–4.9)
Alkaline Phosphatase: 90 IU/L (ref 47–123)
BUN/Creatinine Ratio: 13 (ref 9–20)
BUN: 11 mg/dL (ref 6–24)
Bilirubin Total: 0.3 mg/dL (ref 0.0–1.2)
CO2: 24 mmol/L (ref 20–29)
Calcium: 9.1 mg/dL (ref 8.7–10.2)
Chloride: 102 mmol/L (ref 96–106)
Creatinine, Ser: 0.86 mg/dL (ref 0.76–1.27)
Globulin, Total: 2.5 g/dL (ref 1.5–4.5)
Glucose: 122 mg/dL — ABNORMAL HIGH (ref 70–99)
Potassium: 3.5 mmol/L (ref 3.5–5.2)
Sodium: 140 mmol/L (ref 134–144)
Total Protein: 6.5 g/dL (ref 6.0–8.5)
eGFR: 101 mL/min/1.73 (ref >59)

## 2024-02-11 LAB — CBC WITH DIFFERENTIAL/PLATELET
Basophils Absolute: 0.1 x10E3/uL (ref 0.0–0.2)
Basos: 1 %
EOS (ABSOLUTE): 0.2 x10E3/uL (ref 0.0–0.4)
Eos: 3 %
Hematocrit: 41.2 % (ref 37.5–51.0)
Hemoglobin: 13.4 g/dL (ref 13.0–17.7)
Immature Grans (Abs): 0 x10E3/uL (ref 0.0–0.1)
Immature Granulocytes: 0 %
Lymphocytes Absolute: 1.4 x10E3/uL (ref 0.7–3.1)
Lymphs: 21 %
MCH: 29.2 pg (ref 26.6–33.0)
MCHC: 32.5 g/dL (ref 31.5–35.7)
MCV: 90 fL (ref 79–97)
Monocytes Absolute: 0.4 x10E3/uL (ref 0.1–0.9)
Monocytes: 6 %
Neutrophils Absolute: 4.7 x10E3/uL (ref 1.4–7.0)
Neutrophils: 69 %
Platelets: 401 x10E3/uL (ref 150–450)
RBC: 4.59 x10E6/uL (ref 4.14–5.80)
RDW: 13.1 % (ref 11.6–15.4)
WBC: 6.9 x10E3/uL (ref 3.4–10.8)

## 2024-02-11 LAB — LIPID PANEL
Chol/HDL Ratio: 4.3 ratio (ref 0.0–5.0)
Cholesterol, Total: 191 mg/dL (ref 100–199)
HDL: 44 mg/dL (ref >39)
LDL Chol Calc (NIH): 111 mg/dL — ABNORMAL HIGH (ref 0–99)
Triglycerides: 210 mg/dL — ABNORMAL HIGH (ref 0–149)
VLDL Cholesterol Cal: 36 mg/dL (ref 5–40)

## 2024-02-11 LAB — HEMOGLOBIN A1C
Est. average glucose Bld gHb Est-mCnc: 114 mg/dL
Hgb A1c MFr Bld: 5.6 % (ref 4.8–5.6)

## 2024-02-12 ENCOUNTER — Ambulatory Visit (INDEPENDENT_AMBULATORY_CARE_PROVIDER_SITE_OTHER): Payer: Self-pay | Admitting: Primary Care

## 2024-02-12 MED ORDER — PRAVASTATIN SODIUM 40 MG PO TABS: 40.0000 mg | ORAL_TABLET | Freq: Every day | ORAL | 1 refills | Status: AC

## 2024-02-16 ENCOUNTER — Other Ambulatory Visit (INDEPENDENT_AMBULATORY_CARE_PROVIDER_SITE_OTHER): Payer: Self-pay

## 2024-02-16 DIAGNOSIS — Z1211 Encounter for screening for malignant neoplasm of colon: Secondary | ICD-10-CM

## 2024-02-17 LAB — FECAL OCCULT BLOOD, IMMUNOCHEMICAL: Fecal Occult Bld: NEGATIVE

## 2024-02-20 ENCOUNTER — Other Ambulatory Visit (HOSPITAL_COMMUNITY): Payer: Self-pay

## 2024-02-23 ENCOUNTER — Encounter (INDEPENDENT_AMBULATORY_CARE_PROVIDER_SITE_OTHER): Payer: Self-pay

## 2024-03-02 ENCOUNTER — Ambulatory Visit (INDEPENDENT_AMBULATORY_CARE_PROVIDER_SITE_OTHER): Payer: Self-pay | Admitting: Primary Care

## 2024-03-05 ENCOUNTER — Other Ambulatory Visit: Payer: Self-pay

## 2024-03-18 ENCOUNTER — Telehealth (INDEPENDENT_AMBULATORY_CARE_PROVIDER_SITE_OTHER): Payer: Self-pay | Admitting: Primary Care

## 2024-03-18 NOTE — Telephone Encounter (Signed)
 Left VM with pt about their upcoming appt. Pt did not answer

## 2024-03-19 ENCOUNTER — Encounter (INDEPENDENT_AMBULATORY_CARE_PROVIDER_SITE_OTHER): Payer: Self-pay | Admitting: Primary Care

## 2024-03-19 ENCOUNTER — Ambulatory Visit (INDEPENDENT_AMBULATORY_CARE_PROVIDER_SITE_OTHER): Payer: Self-pay | Admitting: Primary Care

## 2024-03-19 VITALS — BP 147/84 | HR 75 | Resp 16 | Ht 64.5 in | Wt 203.6 lb

## 2024-03-19 DIAGNOSIS — I10 Essential (primary) hypertension: Secondary | ICD-10-CM

## 2024-03-19 MED ORDER — AMLODIPINE BESYLATE 10 MG PO TABS: 10.0000 mg | ORAL_TABLET | Freq: Every day | ORAL | 1 refills | Status: AC

## 2024-03-19 NOTE — Progress Notes (Signed)
 " [facility]   Perry Hughes is a 59 y.o. male presents for hypertension evaluation, Denies shortness of breath, headaches, chest pain or lower extremity edema, sudden onset, vision changes, unilateral weakness, dizziness, paresthesias   Patient reports adherence with medications.   Past Medical History:  Diagnosis Date   Diabetes mellitus without complication (HCC)    Hypertension    No past surgical history on file. Allergies[1] Medications Ordered Prior to Encounter[2] Social History   Socioeconomic History   Marital status: Married    Spouse name: Not on file   Number of children: Not on file   Years of education: Not on file   Highest education level: Not on file  Occupational History   Not on file  Tobacco Use   Smoking status: Former    Current packs/day: 0.00    Types: Cigarettes    Quit date: 09/13/2020    Years since quitting: 3.5   Smokeless tobacco: Never  Substance and Sexual Activity   Alcohol use: Yes   Drug use: No   Sexual activity: Not on file  Other Topics Concern   Not on file  Social History Narrative   Not on file   Social Drivers of Health   Tobacco Use: Medium Risk (02/10/2024)   Patient History    Smoking Tobacco Use: Former    Smokeless Tobacco Use: Never    Passive Exposure: Not on Actuary Strain: Not on file  Food Insecurity: No Food Insecurity (03/19/2024)   Epic    Worried About Programme Researcher, Broadcasting/film/video in the Last Year: Never true    Ran Out of Food in the Last Year: Never true  Transportation Needs: No Transportation Needs (03/19/2024)   Epic    Lack of Transportation (Medical): No    Lack of Transportation (Non-Medical): No  Physical Activity: Not on file  Stress: Not on file  Social Connections: Not on file  Intimate Partner Violence: Not At Risk (03/19/2024)   Epic    Fear of Current or Ex-Partner: No    Emotionally Abused: No    Physically Abused: No    Sexually Abused: No  Depression  (PHQ2-9): Low Risk (03/19/2024)   Depression (PHQ2-9)    PHQ-2 Score: 0  Alcohol Screen: Not on file  Housing: Low Risk (03/19/2024)   Epic    Unable to Pay for Housing in the Last Year: No    Number of Times Moved in the Last Year: 0    Homeless in the Last Year: No  Utilities: Not At Risk (03/19/2024)   Epic    Threatened with loss of utilities: No  Health Literacy: Not on file   No family history on file. Health Maintenance  Topic Date Due   Hepatitis B Vaccines 19-59 Average Risk (1 of 3 - 19+ 3-dose series) Never done   Colonoscopy  Never done   COVID-19 Vaccine (1 - 2025-26 season) Never done   Zoster Vaccines- Shingrix (1 of 2) 05/10/2024 (Originally 02/23/2016)   Influenza Vaccine  05/25/2024 (Originally 09/26/2023)   DTaP/Tdap/Td (1 - Tdap) 05/27/2024 (Originally 02/22/1985)   Pneumococcal Vaccine: 50+ Years (1 of 2 - PCV) 02/09/2025 (Originally 02/22/1985)   Diabetic kidney evaluation - Urine ACR  08/10/2024   Diabetic kidney evaluation - eGFR measurement  02/09/2025   COLON CANCER SCREENING ANNUAL FOBT  02/15/2025   HPV VACCINES (No Doses Required) Completed   Hepatitis C Screening  Completed   HIV Screening  Completed   Meningococcal  B Vaccine  Aged Out     OBJECTIVE: BP (!) 147/84   Pulse 75   Resp 16   Ht 5' 4.5 (1.638 m)   Wt 203 lb 9.6 oz (92.4 kg)   SpO2 98%   BMI 34.41 kg/m    Physical Exam Vitals reviewed.  Constitutional:      Appearance: He is obese.  HENT:     Head: Normocephalic.     Right Ear: Tympanic membrane and external ear normal.     Left Ear: Tympanic membrane and external ear normal.     Nose: Nose normal.  Eyes:     Extraocular Movements: Extraocular movements intact.     Pupils: Pupils are equal, round, and reactive to light.  Cardiovascular:     Rate and Rhythm: Normal rate and regular rhythm.  Pulmonary:     Effort: Pulmonary effort is normal.     Breath sounds: Normal breath sounds.  Abdominal:     General: Bowel sounds  are normal.     Palpations: Abdomen is soft.  Musculoskeletal:        General: Normal range of motion.     Cervical back: Normal range of motion and neck supple.  Skin:    General: Skin is warm and dry.  Neurological:     Mental Status: He is oriented to person, place, and time.  Psychiatric:        Mood and Affect: Mood normal.        Behavior: Behavior normal.        Thought Content: Thought content normal.        Judgment: Judgment normal.      ROS  Last 3 Office BP readings: BP Readings from Last 3 Encounters:  02/10/24 (!) 152/82  09/16/23 126/79  06/10/23 (!) 144/92    BMET    Component Value Date/Time   NA 140 02/10/2024 1502   K 3.5 02/10/2024 1502   CL 102 02/10/2024 1502   CO2 24 02/10/2024 1502   GLUCOSE 122 (H) 02/10/2024 1502   GLUCOSE 135 (H) 08/28/2020 0037   BUN 11 02/10/2024 1502   CREATININE 0.86 02/10/2024 1502   CALCIUM  9.1 02/10/2024 1502   GFRNONAA >60 08/28/2020 0037   GFRAA >60 07/25/2018 0454    Renal function: CrCl cannot be calculated (Patient's most recent lab result is older than the maximum 21 days allowed.).  Clinical ASCVD: No  The ASCVD Risk score (Arnett DK, et al., 2019) failed to calculate for the following reasons:   Risk score cannot be calculated because patient has a medical history suggesting prior/existing ASCVD   * - Cholesterol units were assumed  ASCVD risk factors include- CHAD   ASSESSMENT & PLAN:  Perry Hughes was seen today for blood pressure check.  Diagnoses and all orders for this visit:  Primary hypertension  -Counseled on lifestyle modifications for blood pressure control including reduced dietary sodium, increased exercise, weight reduction and adequate sleep. Also, educated patient about the risk for cardiovascular events, stroke and heart attack. Also counseled patient about the importance of medication adherence. If you participate in smoking, it is important to stop using tobacco as this will increase  the risks associated with uncontrolled blood pressure.  -     amLODipine  (NORVASC ) 10 MG tablet; Take 1 tablet (10 mg total) by mouth daily.  This note has been created with Education officer, environmental. Any transcriptional errors are unintentional.   Perry SHAUNNA Bohr, NP 03/19/2024, 10:57  AM       [1] No Known Allergies [2]  Current Outpatient Medications on File Prior to Visit  Medication Sig Dispense Refill   amLODipine  (NORVASC ) 5 MG tablet Take 1 tablet (5 mg total) by mouth daily. 90 tablet 1   aspirin  81 MG EC tablet Take 1 tablet (81 mg total) by mouth daily. Swallow whole. 30 tablet 2   hydrochlorothiazide  (HYDRODIURIL ) 25 MG tablet Take 1 tablet (25 mg total) by mouth daily. 90 tablet 1   pravastatin  (PRAVACHOL ) 40 MG tablet Take 1 tablet (40 mg total) by mouth daily. 90 tablet 1   tadalafil  (CIALIS ) 20 MG tablet Take 1 tablet (20 mg total) by mouth daily as needed. 10 tablet 11   tamsulosin  (FLOMAX ) 0.4 MG CAPS capsule Take 1 capsule (0.4 mg total) by mouth daily after supper. 90 capsule 3   No current facility-administered medications on file prior to visit.   "

## 2024-03-22 ENCOUNTER — Ambulatory Visit: Payer: Self-pay | Admitting: Urology

## 2024-04-07 ENCOUNTER — Ambulatory Visit: Payer: Self-pay | Admitting: Urology

## 2024-06-17 ENCOUNTER — Ambulatory Visit (INDEPENDENT_AMBULATORY_CARE_PROVIDER_SITE_OTHER): Payer: Self-pay | Admitting: Primary Care
